# Patient Record
Sex: Female | Born: 1985 | Hispanic: No | Marital: Married | Smoking: Never smoker
Health system: Southern US, Community
[De-identification: ages and names within clinical notes are randomized; demographics above are authoritative.]

## PROBLEM LIST (undated history)

## (undated) DIAGNOSIS — R102 Pelvic and perineal pain unspecified side: Secondary | ICD-10-CM

## (undated) DIAGNOSIS — Z789 Other specified health status: Secondary | ICD-10-CM

## (undated) HISTORY — PX: NO PAST SURGERIES: SHX2092

## (undated) HISTORY — DX: Pelvic and perineal pain: R10.2

## (undated) HISTORY — DX: Pelvic and perineal pain unspecified side: R10.20

---

## 2017-06-12 NOTE — L&D Delivery Note (Signed)
Delivery Note Progressed to complete dilation with vertex at +1.  AROM clear fluid. Pushed well to SVD.  At 8:56 PM a viable and healthy female was delivered via Vaginal, Spontaneous (Presentation: LOA).  APGAR: 9, 9; weight  8+6 .   Placenta status: Spontaneous and grossly intact with 3 vessel Cord:  with the following complications: none  Anesthesia:  epidural Episiotomy: None Lacerations: 3rd degree, 3C Suture Repair: 3.0 vicryl  Repair of 3rd degree done in usual fashion by Dr Adrian Blackwater and rest of repair done by me Est. Blood Loss (mL):  300  Mom to postpartum.  Baby to Couplet care / Skin to Skin.  Wynelle Bourgeois 03/12/2018, 9:57 PM  Please schedule this patient for Postpartum visit in: 4 weeks with the following provider: Any provider For C/S patients schedule nurse incision check in weeks 2 weeks: no Low risk pregnancy complicated by: none Delivery mode:  SVD Anticipated Birth Control:  Nexplanon PP Procedures needed: none  Schedule Integrated BH visit: no

## 2017-07-13 ENCOUNTER — Ambulatory Visit: Payer: Self-pay | Admitting: General Practice

## 2017-07-13 ENCOUNTER — Encounter: Payer: Self-pay | Admitting: General Practice

## 2017-07-13 DIAGNOSIS — Z3201 Encounter for pregnancy test, result positive: Secondary | ICD-10-CM

## 2017-07-13 MED ORDER — METOCLOPRAMIDE HCL 10 MG PO TABS: 10.0000 mg | ORAL_TABLET | Freq: Three times a day (TID) | ORAL | 0 refills | Status: DC | PRN

## 2017-07-13 MED ORDER — PRENATAL VITAMINS 0.8 MG PO TABS: 1.0000 | ORAL_TABLET | Freq: Every day | ORAL | 2 refills | Status: DC

## 2017-07-13 NOTE — Progress Notes (Signed)
Chart reviewed for nurse visit. Agree with plan of care.   Nautia Lem Y, DO   

## 2017-07-13 NOTE — Progress Notes (Signed)
Patient here for UPT today. UPT +. Patient has not taken any tests at home. LMP 05/29/17 EDD 03/05/18 6575w3d. Patient denies taking any meds or vitamins. Rx sent for PNV. Recommended she begin prenatal care. Patient verbalized understanding and had no questions

## 2017-08-22 ENCOUNTER — Ambulatory Visit (INDEPENDENT_AMBULATORY_CARE_PROVIDER_SITE_OTHER): Payer: Medicaid Other | Admitting: Advanced Practice Midwife

## 2017-08-22 ENCOUNTER — Encounter: Payer: Self-pay | Admitting: General Practice

## 2017-08-22 ENCOUNTER — Encounter: Payer: Self-pay | Admitting: Advanced Practice Midwife

## 2017-08-22 VITALS — BP 120/76 | HR 76 | Ht 59.45 in | Wt 132.1 lb

## 2017-08-22 DIAGNOSIS — Z3481 Encounter for supervision of other normal pregnancy, first trimester: Secondary | ICD-10-CM

## 2017-08-22 DIAGNOSIS — T85848A Pain due to other internal prosthetic devices, implants and grafts, initial encounter: Secondary | ICD-10-CM

## 2017-08-22 DIAGNOSIS — Z789 Other specified health status: Secondary | ICD-10-CM | POA: Insufficient documentation

## 2017-08-22 DIAGNOSIS — Z23 Encounter for immunization: Secondary | ICD-10-CM | POA: Diagnosis not present

## 2017-08-22 DIAGNOSIS — Z3480 Encounter for supervision of other normal pregnancy, unspecified trimester: Secondary | ICD-10-CM | POA: Insufficient documentation

## 2017-08-22 LAB — POCT URINALYSIS DIP (DEVICE)
Bilirubin Urine: NEGATIVE
Glucose, UA: NEGATIVE mg/dL
Ketones, ur: NEGATIVE mg/dL
NITRITE: NEGATIVE
PH: 5.5 (ref 5.0–8.0)
PROTEIN: NEGATIVE mg/dL
Specific Gravity, Urine: >=1.03 (ref 1.005–1.030)
Urobilinogen, UA: 0.2 mg/dL (ref 0.0–1.0)

## 2017-08-22 MED ORDER — IBUPROFEN 600 MG PO TABS: 600.0000 mg | ORAL_TABLET | Freq: Four times a day (QID) | ORAL | 0 refills | Status: DC | PRN

## 2017-08-22 MED ORDER — OXYCODONE-ACETAMINOPHEN 5-325 MG PO TABS: 1.0000 | ORAL_TABLET | Freq: Four times a day (QID) | ORAL | 0 refills | Status: DC | PRN

## 2017-08-22 MED ORDER — CONCEPT OB 130-92.4-1 MG PO CAPS: 1.0000 | ORAL_CAPSULE | Freq: Every day | ORAL | 12 refills | Status: DC

## 2017-08-22 NOTE — Patient Instructions (Addendum)
Phone Numbers for Area Dentists **Costs for these may vary depending on what is actually done** Consider contacting other dentists directing in the community Provider/Phone number                                  Cost if Known Dr. Randa Evens, [facility] [place], [place]          $161 for single tooth removal [phone]  [facility]                           [phone] for single tooth extraction [phone]  [facility]                                   $5 Teeth Cleaning $4 xray Ask for Lemmie Evens [address] [facility]: [phone] [place]: [phone]  [facility] [address]                                       No Medicaid, no insurance [phone]                                                     $150 for exam/ xray Fax [phone]  Dr. Melynda Ripple                                                             [phone] for extraction [address] ( [address])                             $74 for exam  Marolyn Hammock DMD [facility]                                  $50 emergency exam,  [facility]                               $20 single xray/$75 panorex [address] [facility]                                                     $120 simple extraction [phone]                                                       $180 surgical extraction 10% discount cash pay [place] Dr. Baldo Ash Little [address] [phone] Dr.  Helen HashimotoGeorge Wallace [address]  [phone]  Safe Medications in Pregnancy   Acne: Benzoyl Peroxide Salicylic Acid  Backache/Headache: Tylenol: 2 regular strength every 4 hours OR              2 Extra strength every 6 hours  Colds/Coughs/Allergies: Benadryl (alcohol free) 25 mg every 6 hours as needed Breath right strips Claritin Cepacol throat lozenges Chloraseptic throat spray Cold-Eeze- up to three times per day Cough drops, alcohol free Flonase (by prescription  only) Guaifenesin Mucinex Robitussin DM (plain only, alcohol free) Saline nasal spray/drops Sudafed (pseudoephedrine) & Actifed ** use only after [redacted] weeks gestation and if you do not have high blood pressure Tylenol Vicks Vaporub Zinc lozenges Zyrtec   Constipation: Colace Ducolax suppositories Fleet enema Glycerin suppositories Metamucil Milk of magnesia Miralax Senokot Smooth move tea  Diarrhea: Kaopectate Imodium A-D  *NO pepto Bismol  Hemorrhoids: Anusol Anusol HC Preparation H Tucks  Indigestion: Tums Maalox Mylanta Zantac  Pepcid  Insomnia: Benadryl (alcohol free) 25mg  every 6 hours as needed Tylenol PM Unisom, no Gelcaps  Leg Cramps: Tums MagGel  Nausea/Vomiting:  Bonine Dramamine Emetrol Ginger extract Sea bands Meclizine  Nausea medication to take during pregnancy:  Unisom (doxylamine succinate 25 mg tablets) Take one tablet daily at bedtime. If symptoms are not adequately controlled, the dose can be increased to a maximum recommended dose of two tablets daily (1/2 tablet in the morning, 1/2 tablet mid-afternoon and one at bedtime). Vitamin B6 100mg  tablets. Take one tablet twice a day (up to 200 mg per day).  Skin Rashes: Aveeno products Benadryl cream or 25mg  every 6 hours as needed Calamine Lotion 1% cortisone cream  Yeast infection: Gyne-lotrimin 7 Monistat 7   Ibuprofen should not be taken after 28 weeks or for more than two days.   **If taking multiple medications, please check labels to avoid duplicating the same active ingredients **take medication as directed on the label ** Do not exceed 4000 mg of tylenol in 24 hours **Do not take medications that contain aspirin or ibuprofen

## 2017-08-22 NOTE — Progress Notes (Signed)
[employer] interpreter Kenard GowerNuha Mohammad

## 2017-08-22 NOTE — Progress Notes (Signed)
  Subjective:    Elizabeth Odonnell is being seen today for her first obstetrical visit. She is at 4559w1d gestation by certain, regular LMP. Her obstetrical history is significant for Nothing. Relationship with FOB: spouse, living together. Patient does intend to breast feed. Pregnancy history fully reviewed.  Patient reports no complaints.  Live Arabic interpreter used.   Review of Systems:   Review of Systems  Constitutional: Negative for fever.  Gastrointestinal: Negative for abdominal pain, nausea and vomiting.  Genitourinary: Negative for vaginal bleeding.    Objective:     BP 120/76   Pulse 76   Ht 4' 11.45" (1.51 m)   Wt 132 lb 1.6 oz (59.9 kg)   LMP 05/29/2017 (Exact Date)   BMI 26.28 kg/m  Physical Exam  Nursing note and vitals reviewed. Constitutional: She is oriented to person, place, and time. She appears well-developed and well-nourished. No distress.  Eyes: No scleral icterus.  Neck: No thyromegaly present.  Cardiovascular: Normal rate, regular rhythm and normal heart sounds.  Respiratory: Effort normal and breath sounds normal. No respiratory distress.  Genitourinary:  Genitourinary Comments: Deferred  Neurological: She is alert and oriented to person, place, and time.  Skin: Skin is warm and dry.  Psychiatric: She has a normal mood and affect.    Maternal Exam:  Abdomen: Patient reports no abdominal tenderness. Fundal height is 17 cm.    Introitus: not evaluated.   Cervix: not evaluated.   Fetal Exam Fetal Monitor Review: Mode: hand-held doppler probe.   Baseline rate: 151.         Assessment:    Pregnancy: G2P1001 Patient Active Problem List   Diagnosis Date Noted  . Supervision of other normal pregnancy, antepartum 08/22/2017  . Language barrier 08/22/2017       Plan:     1. Supervision of other normal pregnancy, antepartum  - Culture, OB Urine - GC/Chlamydia probe amp ([place]ott[place] - Hemoglobinopathy  Evaluation - Obstetric Panel, Including HIV - Flu Vaccine QUAD 36+ mos IM - HgB A1c - Prenat w/o A Vit-FeFum-FePo-FA (CONCEPT OB) 130-92.4-1 MG CAPS; Take 1 tablet by mouth daily.  Dispense: 30 capsule; Refill: 12  2. Language barrier  3. Dental pain  - Dentist list given - Dental letter - IBU PRN sparingly. Percocet #10 for severe pain.   4. Uterine size dates discrepancy   - Dating US w/ Diane in 1 week.   Initial labs NOT drawn. Phlebotomist at lunch. Will come back for lab visit in 1 week Prenatal vitamins. Problem list reviewed and updated. AFP3 discussed: requested when pregnancy Medicaid is active.  Role of ultrasound in pregnancy discussed; fetal survey: requested. Amniocentesis discussed: not indicated. Labs and dating US in 1 week Follow up ROB in 4 weeks.    Elizabeth Odonnell 08/22/2017

## 2017-08-30 ENCOUNTER — Ambulatory Visit: Payer: Self-pay

## 2017-08-30 ENCOUNTER — Ambulatory Visit (INDEPENDENT_AMBULATORY_CARE_PROVIDER_SITE_OTHER): Payer: Medicaid Other | Admitting: General Practice

## 2017-08-30 DIAGNOSIS — Z3491 Encounter for supervision of normal pregnancy, unspecified, first trimester: Secondary | ICD-10-CM

## 2017-08-30 DIAGNOSIS — Z3687 Encounter for antenatal screening for uncertain dates: Secondary | ICD-10-CM

## 2017-08-30 NOTE — Progress Notes (Signed)
Pt informed that the ultrasound is considered a limited OB ultrasound and is not intended to be a complete ultrasound exam.  Patient also informed that the ultrasound is not being completed with the intent of assessing for fetal or placental anomalies or any pelvic abnormalities.  Explained that the purpose of today's ultrasound is to assess for  dating.  Patient acknowledges the purpose of the exam and the limitations of the study.    Ultrasound dating consistent with LMP. New OB labs drawn

## 2017-09-01 LAB — URINE CULTURE, OB REFLEX

## 2017-09-01 LAB — CULTURE, OB URINE

## 2017-09-05 LAB — OBSTETRIC PANEL, INCLUDING HIV
Antibody Screen: NEGATIVE
BASOS ABS: 0 10*3/uL (ref 0.0–0.2)
Basos: 0 %
EOS (ABSOLUTE): 0.1 10*3/uL (ref 0.0–0.4)
Eos: 1 %
HEP B S AG: NEGATIVE
HIV SCREEN 4TH GENERATION: NONREACTIVE
Hematocrit: 35.9 % (ref 34.0–46.6)
Hemoglobin: 12.4 g/dL (ref 11.1–15.9)
IMMATURE GRANULOCYTES: 0 %
Immature Grans (Abs): 0 10*3/uL (ref 0.0–0.1)
LYMPHS ABS: 2.2 10*3/uL (ref 0.7–3.1)
Lymphs: 27 %
MCH: 25.6 pg — AB (ref 26.6–33.0)
MCHC: 34.5 g/dL (ref 31.5–35.7)
MCV: 74 fL — AB (ref 79–97)
MONOS ABS: 0.3 10*3/uL (ref 0.1–0.9)
Monocytes: 4 %
NEUTROS ABS: 5.5 10*3/uL (ref 1.4–7.0)
NEUTROS PCT: 68 %
PLATELETS: 303 10*3/uL (ref 150–379)
RBC: 4.85 x10E6/uL (ref 3.77–5.28)
RDW: 18.6 % — ABNORMAL HIGH (ref 12.3–15.4)
RPR: NONREACTIVE
Rh Factor: POSITIVE
Rubella Antibodies, IGG: 3.88 index (ref >0.99)
WBC: 8.1 10*3/uL (ref 3.4–10.8)

## 2017-09-05 LAB — HEMOGLOBINOPATHY EVALUATION
Ferritin: 15 ng/mL (ref 15–150)
HGB A2 QUANT: 2.1 % (ref 1.8–3.2)
HGB A: 97.9 % (ref 96.4–98.8)
HGB C: 0 %
HGB F QUANT: 0 % (ref 0.0–2.0)
HGB VARIANT: 0 %
Hgb S: 0 %
Hgb Solubility: NEGATIVE

## 2017-09-05 LAB — HEMOGLOBIN A1C
ESTIMATED AVERAGE GLUCOSE: 100 mg/dL
HEMOGLOBIN A1C: 5.1 % (ref 4.8–5.6)

## 2017-09-05 LAB — ALPHA-THALASSEMIA

## 2017-09-19 ENCOUNTER — Other Ambulatory Visit (HOSPITAL_COMMUNITY)
Admission: RE | Admit: 2017-09-19 | Discharge: 2017-09-19 | Disposition: A | Discharge status: Home / Self Care | Payer: Medicaid Other | Source: Ambulatory Visit | Attending: Advanced Practice Midwife | Admitting: Advanced Practice Midwife

## 2017-09-19 ENCOUNTER — Ambulatory Visit (INDEPENDENT_AMBULATORY_CARE_PROVIDER_SITE_OTHER): Payer: Medicaid Other | Admitting: Advanced Practice Midwife

## 2017-09-19 VITALS — BP 122/66 | HR 93 | Wt 134.0 lb

## 2017-09-19 DIAGNOSIS — Z3480 Encounter for supervision of other normal pregnancy, unspecified trimester: Secondary | ICD-10-CM | POA: Insufficient documentation

## 2017-09-19 DIAGNOSIS — B373 Candidiasis of vulva and vagina: Secondary | ICD-10-CM

## 2017-09-19 DIAGNOSIS — B3731 Acute candidiasis of vulva and vagina: Secondary | ICD-10-CM

## 2017-09-19 NOTE — Patient Instructions (Addendum)
Take Monistat 7 for yeast infection  Second Trimester of Pregnancy The second trimester is from week 13 through week 28, month 4 through 6. This is often the time in pregnancy that you feel your best. Often times, morning sickness has lessened or quit. You may have more energy, and you may get hungry more often. Your unborn baby (fetus) is growing rapidly. At the end of the sixth month, he or she is about 9 inches long and weighs about 1 pounds. You will likely feel the baby move (quickening) between 18 and 20 weeks of pregnancy. Follow these instructions at home:  Avoid all smoking, herbs, and alcohol. Avoid drugs not approved by your doctor.  Do not use any tobacco products, including cigarettes, chewing tobacco, and electronic cigarettes. If you need help quitting, ask your doctor. You may get counseling or other support to help you quit.  Only take medicine as told by your doctor. Some medicines are safe and some are not during pregnancy.  Exercise only as told by your doctor. Stop exercising if you start having cramps.  Eat regular, healthy meals.  Wear a good support bra if your breasts are tender.  Do not use hot tubs, steam rooms, or saunas.  Wear your seat belt when driving.  Avoid raw meat, uncooked cheese, and liter boxes and soil used by cats.  Take your prenatal vitamins.  Take 1500-2000 milligrams of calcium daily starting at the 20th week of pregnancy until you deliver your baby.  Try taking medicine that helps you poop (stool softener) as needed, and if your doctor approves. Eat more fiber by eating fresh fruit, vegetables, and whole grains. Drink enough fluids to keep your pee (urine) clear or pale yellow.  Take warm water baths (sitz baths) to soothe pain or discomfort caused by hemorrhoids. Use hemorrhoid cream if your doctor approves.  If you have puffy, bulging veins (varicose veins), wear support hose. Raise (elevate) your feet for 15 minutes, 3-4 times a day.  Limit salt in your diet.  Avoid heavy lifting, wear low heals, and sit up straight.  Rest with your legs raised if you have leg cramps or low back pain.  Visit your dentist if you have not gone during your pregnancy. Use a soft toothbrush to brush your teeth. Be gentle when you floss.  You can have sex (intercourse) unless your doctor tells you not to.  Go to your doctor visits. Get help if:  You feel dizzy.  You have mild cramps or pressure in your lower belly (abdomen).  You have a nagging pain in your belly area.  You continue to feel sick to your stomach (nauseous), throw up (vomit), or have watery poop (diarrhea).  You have bad smelling fluid coming from your vagina.  You have pain with peeing (urination). Get help right away if:  You have a fever.  You are leaking fluid from your vagina.  You have spotting or bleeding from your vagina.  You have severe belly cramping or pain.  You lose or gain weight rapidly.  You have trouble catching your breath and have chest pain.  You notice sudden or extreme puffiness (swelling) of your face, hands, ankles, feet, or legs.  You have not felt the baby move in over an hour.  You have severe headaches that do not go away with medicine.  You have vision changes. This information is not intended to replace advice given to you by your health care provider. Make sure you discuss any  questions you have with your health care provider. Document Released: 08/23/2009 Document Revised: 11/04/2015 Document Reviewed: 07/30/2012 Elsevier Interactive Patient Education  2017 [employer].

## 2017-09-19 NOTE — Progress Notes (Signed)
bInterpreter Elizabeth Odonnell

## 2017-09-19 NOTE — Progress Notes (Signed)
   PRENATAL VISIT NOTE  Subjective:  Elizabeth Odonnell is a 32 y.o. G2P1001 at 5849w1d being seen today for ongoing prenatal care.  She is currently monitored for the following issues for this low-risk pregnancy and has Supervision of other normal pregnancy, antepartum and Language barrier on their problem list.  Patient reports no complaints.  Contractions: Not present. Vag. Bleeding: None.  Movement: Present. Denies leaking of fluid.   The following portions of the patient's history were reviewed and updated as appropriate: allergies, current medications, past family history, past medical history, past social history, past surgical history and problem list. Problem list updated.  Objective:   Vitals:   09/19/17 1019  BP: 122/66  Pulse: 93  Weight: 134 lb (60.8 kg)    Fetal Status: Fetal Heart Rate (bpm): 149  Fundal Height: 18 cm Movement: Present     General:  Alert, oriented and cooperative. Patient is in no acute distress.  Skin: Skin is warm and dry. No rash noted.   Cardiovascular: Normal heart rate noted  Respiratory: Normal respiratory effort, no problems with respiration noted  Abdomen: Soft, gravid, appropriate for gestational age.  Pain/Pressure: Absent     Pelvic: Cervical exam performed Dilation: Closed    Moderate amount if thick, white, curdlike discharge. Erythematous vaginal walls.   Extremities: Normal range of motion.  Edema: None  Mental Status: Normal mood and affect. Normal behavior. Normal judgment and thought content.   Reviewed OB Panel  Assessment and Plan:  Pregnancy: G2P1001 at 4549w1d  1. Supervision of other normal pregnancy, antepartum  - US MFM OB COMP + 14 WK; Future - Cytology - PAP  2. Vaginal Yeast infection  - OTC Monistat 7  Preterm labor symptoms and general obstetric precautions including but not limited to vaginal bleeding, contractions, leaking of fluid and fetal movement were reviewed in detail with the patient. Please  refer to After Visit Summary for other counseling recommendations.  Return in about 1 month (around 10/17/2017) for ROB.  Future Appointments  Date Time Provider Department Center  10/12/2017  8:15 AM [facility] US 4 [facility]    Dorathy KinsmanVirginia Jacquese Cassarino, [place]

## 2017-09-21 LAB — CYTOLOGY - PAP
Diagnosis: NEGATIVE
HPV (WINDOPATH): NOT DETECTED

## 2017-10-12 ENCOUNTER — Ambulatory Visit (HOSPITAL_COMMUNITY)
Admission: RE | Admit: 2017-10-12 | Discharge: 2017-10-12 | Disposition: A | Discharge status: Home / Self Care | Payer: Medicaid Other | Source: Ambulatory Visit | Attending: Advanced Practice Midwife | Admitting: Advanced Practice Midwife

## 2017-10-12 DIAGNOSIS — Z3A19 19 weeks gestation of pregnancy: Secondary | ICD-10-CM | POA: Diagnosis not present

## 2017-10-12 DIAGNOSIS — Z3480 Encounter for supervision of other normal pregnancy, unspecified trimester: Secondary | ICD-10-CM

## 2017-10-12 DIAGNOSIS — Z363 Encounter for antenatal screening for malformations: Secondary | ICD-10-CM | POA: Diagnosis present

## 2017-10-12 DIAGNOSIS — Z3689 Encounter for other specified antenatal screening: Secondary | ICD-10-CM | POA: Insufficient documentation

## 2017-10-17 ENCOUNTER — Other Ambulatory Visit: Payer: Self-pay | Admitting: Medical

## 2017-10-17 ENCOUNTER — Ambulatory Visit (INDEPENDENT_AMBULATORY_CARE_PROVIDER_SITE_OTHER): Payer: Medicaid Other | Admitting: Medical

## 2017-10-17 ENCOUNTER — Encounter: Payer: Self-pay | Admitting: *Deleted

## 2017-10-17 ENCOUNTER — Encounter: Payer: Self-pay | Admitting: Medical

## 2017-10-17 VITALS — BP 118/78 | HR 96 | Wt 135.1 lb

## 2017-10-17 DIAGNOSIS — Z0489 Encounter for examination and observation for other specified reasons: Secondary | ICD-10-CM

## 2017-10-17 DIAGNOSIS — Z3A26 26 weeks gestation of pregnancy: Secondary | ICD-10-CM

## 2017-10-17 DIAGNOSIS — IMO0002 Reserved for concepts with insufficient information to code with codable children: Secondary | ICD-10-CM

## 2017-10-17 DIAGNOSIS — Z3480 Encounter for supervision of other normal pregnancy, unspecified trimester: Secondary | ICD-10-CM

## 2017-10-17 NOTE — Patient Instructions (Signed)
Second Trimester of Pregnancy The second trimester is from week 13 through week 28, month 4 through 6. This is often the time in pregnancy that you feel your best. Often times, morning sickness has lessened or quit. You may have more energy, and you may get hungry more often. Your unborn baby (fetus) is growing rapidly. At the end of the sixth month, he or she is about 9 inches long and weighs about 1 pounds. You will likely feel the baby move (quickening) between 18 and 20 weeks of pregnancy. Follow these instructions at home:  Avoid all smoking, herbs, and alcohol. Avoid drugs not approved by your doctor.  Do not use any tobacco products, including cigarettes, chewing tobacco, and electronic cigarettes. If you need help quitting, ask your doctor. You may get counseling or other support to help you quit.  Only take medicine as told by your doctor. Some medicines are safe and some are not during pregnancy.  Exercise only as told by your doctor. Stop exercising if you start having cramps.  Eat regular, healthy meals.  Wear a good support bra if your breasts are tender.  Do not use hot tubs, steam rooms, or saunas.  Wear your seat belt when driving.  Avoid raw meat, uncooked cheese, and liter boxes and soil used by cats.  Take your prenatal vitamins.  Take 1500-2000 milligrams of calcium daily starting at the 20th week of pregnancy until you deliver your baby.  Try taking medicine that helps you poop (stool softener) as needed, and if your doctor approves. Eat more fiber by eating fresh fruit, vegetables, and whole grains. Drink enough fluids to keep your pee (urine) clear or pale yellow.  Take warm water baths (sitz baths) to soothe pain or discomfort caused by hemorrhoids. Use hemorrhoid cream if your doctor approves.  If you have puffy, bulging veins (varicose veins), wear support hose. Raise (elevate) your feet for 15 minutes, 3-4 times a day. Limit salt in your diet.  Avoid heavy  lifting, wear low heals, and sit up straight.  Rest with your legs raised if you have leg cramps or low back pain.  Visit your dentist if you have not gone during your pregnancy. Use a soft toothbrush to brush your teeth. Be gentle when you floss.  You can have sex (intercourse) unless your doctor tells you not to.  Go to your doctor visits. Get help if:  You feel dizzy.  You have mild cramps or pressure in your lower belly (abdomen).  You have a nagging pain in your belly area.  You continue to feel sick to your stomach (nauseous), throw up (vomit), or have watery poop (diarrhea).  You have bad smelling fluid coming from your vagina.  You have pain with peeing (urination). Get help right away if:  You have a fever.  You are leaking fluid from your vagina.  You have spotting or bleeding from your vagina.  You have severe belly cramping or pain.  You lose or gain weight rapidly.  You have trouble catching your breath and have chest pain.  You notice sudden or extreme puffiness (swelling) of your face, hands, ankles, feet, or legs.  You have not felt the baby move in over an hour.  You have severe headaches that do not go away with medicine.  You have vision changes. This information is not intended to replace advice given to you by your health care provider. Make sure you discuss any questions you have with your health care   provider. Document Released: 08/23/2009 Document Revised: 11/04/2015 Document Reviewed: 07/30/2012 Elsevier Interactive Patient Education  2017 [employer] Inc[employer]  

## 2017-10-17 NOTE — Progress Notes (Signed)
   PRENATAL VISIT NOTE  Subjective:  Elizabeth Odonnell is a 32 y.o. G2P1001 at [redacted]w[redacted]d being seen today for ongoing prenatal care.  She is currently monitored for the following issues for this low-risk pregnancy and has Supervision of other normal pregnancy, antepartum and Language barrier on their problem list.  Patient reports no complaints.  Contractions: Not present. Vag. Bleeding: None.  Movement: Present. Denies leaking of fluid.   The following portions of the patient's history were reviewed and updated as appropriate: allergies, current medications, past family history, past medical history, past social history, past surgical history and problem list. Problem list updated.  Objective:   Vitals:   10/17/17 1020  BP: 118/78  Pulse: 96  Weight: 135 lb 1.6 oz (61.3 kg)    Fetal Status: Fetal Heart Rate (bpm): 143 Fundal Height: 21 cm Movement: Present     General:  Alert, oriented and cooperative. Patient is in no acute distress.  Skin: Skin is warm and dry. No rash noted.   Cardiovascular: Normal heart rate noted  Respiratory: Normal respiratory effort, no problems with respiration noted  Abdomen: Soft, gravid, appropriate for gestational age.  Pain/Pressure: Absent     Pelvic: Cervical exam deferred        Extremities: Normal range of motion.  Edema: None  Mental Status: Normal mood and affect. Normal behavior. Normal judgment and thought content.   Assessment and Plan:  Pregnancy: G2P1001 at [redacted]w[redacted]d  1. Supervision of other normal pregnancy, antepartum - Genetic Screening - AFP, Serum, Open Spina Bifida  2. Evaluate anatomy not seen on prior sonogram - [place] MFM OB FOLLOW UP; scheduled for ~ 6 weeks out    Preterm labor symptoms and general obstetric precautions including but not limited to vaginal bleeding, contractions, leaking of fluid and fetal movement were reviewed in detail with the patient. Please refer to After Visit Summary for other counseling  recommendations.  Return in about 1 month (around 11/14/2017) for LOB.  No future appointments.  Vonzella Nipple, PA-C

## 2017-10-17 NOTE — Progress Notes (Signed)
Arabic interpreter [place] used for visit

## 2017-10-22 LAB — AFP, SERUM, OPEN SPINA BIFIDA
AFP MoM: 0.73
AFP VALUE AFPOSL: 43.4 ng/mL
Gest. Age on Collection Date: 20 weeks
Maternal Age At EDD: 30.9 yr
OSBR Risk 1 IN: 10000
Test Results:: NEGATIVE
Weight: 135 [lb_av]

## 2017-10-23 ENCOUNTER — Encounter: Payer: Self-pay | Admitting: *Deleted

## 2017-11-20 ENCOUNTER — Ambulatory Visit (INDEPENDENT_AMBULATORY_CARE_PROVIDER_SITE_OTHER): Payer: Medicaid Other | Admitting: Medical

## 2017-11-20 ENCOUNTER — Encounter: Payer: Self-pay | Admitting: Medical

## 2017-11-20 VITALS — BP 122/71 | HR 94 | Wt 138.7 lb

## 2017-11-20 DIAGNOSIS — Z3480 Encounter for supervision of other normal pregnancy, unspecified trimester: Secondary | ICD-10-CM

## 2017-11-20 DIAGNOSIS — Z3482 Encounter for supervision of other normal pregnancy, second trimester: Secondary | ICD-10-CM

## 2017-11-20 DIAGNOSIS — R42 Dizziness and giddiness: Secondary | ICD-10-CM

## 2017-11-20 DIAGNOSIS — N949 Unspecified condition associated with female genital organs and menstrual cycle: Secondary | ICD-10-CM

## 2017-11-20 NOTE — Progress Notes (Signed)
   PRENATAL VISIT NOTE  Subjective:  Myrical Mase Janeth Raseli Odonnell is a 32 y.o. G2P1001 at 5738w0d being seen today for ongoing prenatal care.  She is currently monitored for the following issues for this low-risk pregnancy and has Supervision of other normal pregnancy, antepartum and Language barrier on their problem list.  Patient reports dizziness, intermittent LLQ pain with change of positions.  Contractions: Not present. Vag. Bleeding: None.  Movement: Present. Denies leaking of fluid.   The following portions of the patient's history were reviewed and updated as appropriate: allergies, current medications, past family history, past medical history, past social history, past surgical history and problem list. Problem list updated.  Objective:   Vitals:   11/20/17 1055  BP: 122/71  Pulse: 94  Weight: 138 lb 11.2 oz (62.9 kg)    Fetal Status: Fetal Heart Rate (bpm): 144 Fundal Height: 25 cm Movement: Present     General:  Alert, oriented and cooperative. Patient is in no acute distress.  Skin: Skin is warm and dry. No rash noted.   Cardiovascular: Normal heart rate noted  Respiratory: Normal respiratory effort, no problems with respiration noted  Abdomen: Soft, gravid, appropriate for gestational age.  Pain/Pressure: Absent     Pelvic: Cervical exam deferred        Extremities: Normal range of motion.  Edema: None  Mental Status: Normal mood and affect. Normal behavior. Normal judgment and thought content.   Assessment and Plan:  Pregnancy: G2P1001 at 2638w0d  1. Supervision of other normal pregnancy, antepartum  2. Dizziness - Advised to increase PO hydration and have small frequent snacks throughout the day - Avoid excess sugar - Increase PO hydration   3. Round ligament pain - Tylenol PRN for pain - Slow change of positions  Preterm labor symptoms and general obstetric precautions including but not limited to vaginal bleeding, contractions, leaking of fluid and fetal  movement were reviewed in detail with the patient. Please refer to After Visit Summary for other counseling recommendations.  Return in about 3 weeks (around 12/11/2017) for LOB, 28 week labs (fasting).  Future Appointments  Date Time Provider Department Center  11/28/2017 10:45 AM [facility] US 2 [facility]    Vonzella NippleJulie Wenzel, PA-C

## 2017-11-20 NOTE — Patient Instructions (Addendum)
Second Trimester of Pregnancy The second trimester is from week 13 through week 28, month 4 through 6. This is often the time in pregnancy that you feel your best. Often times, morning sickness has lessened or quit. You may have more energy, and you may get hungry more often. Your unborn baby (fetus) is growing rapidly. At the end of the sixth month, he or she is about 9 inches long and weighs about 1 pounds. You will likely feel the baby move (quickening) between 18 and 20 weeks of pregnancy.  Research childbirth classes and hospital preregistration at [email or web address]  Follow these instructions at home:  Avoid all smoking, herbs, and alcohol. Avoid drugs not approved by your doctor.  Do not use any tobacco products, including cigarettes, chewing tobacco, and electronic cigarettes. If you need help quitting, ask your doctor. You may get counseling or other support to help you quit.  Only take medicine as told by your doctor. Some medicines are safe and some are not during pregnancy.  Exercise only as told by your doctor. Stop exercising if you start having cramps.  Eat regular, healthy meals.  Wear a good support bra if your breasts are tender.  Do not use hot tubs, steam rooms, or saunas.  Wear your seat belt when driving.  Avoid raw meat, uncooked cheese, and liter boxes and soil used by cats.  Take your prenatal vitamins.  Take 1500-2000 milligrams of calcium daily starting at the 20th week of pregnancy until you deliver your baby.  Try taking medicine that helps you poop (stool softener) as needed, and if your doctor approves. Eat more fiber by eating fresh fruit, vegetables, and whole grains. Drink enough fluids to keep your pee (urine) clear or pale yellow.  Take warm water baths (sitz baths) to soothe pain or discomfort caused by hemorrhoids. Use hemorrhoid cream if your doctor approves.  If you have puffy, bulging veins (varicose veins), wear support hose. Raise  (elevate) your feet for 15 minutes, 3-4 times a day. Limit salt in your diet.  Avoid heavy lifting, wear low heals, and sit up straight.  Rest with your legs raised if you have leg cramps or low back pain.  Visit your dentist if you have not gone during your pregnancy. Use a soft toothbrush to brush your teeth. Be gentle when you floss.  You can have sex (intercourse) unless your doctor tells you not to.  Go to your doctor visits.  Get help if:  You feel dizzy.  You have mild cramps or pressure in your lower belly (abdomen).  You have a nagging pain in your belly area.  You continue to feel sick to your stomach (nauseous), throw up (vomit), or have watery poop (diarrhea).  You have bad smelling fluid coming from your vagina.  You have pain with peeing (urination). Get help right away if:  You have a fever.  You are leaking fluid from your vagina.  You have spotting or bleeding from your vagina.  You have severe belly cramping or pain.  You lose or gain weight rapidly.  You have trouble catching your breath and have chest pain.  You notice sudden or extreme puffiness (swelling) of your face, hands, ankles, feet, or legs.  You have not felt the baby move in over an hour.  You have severe headaches that do not go away with medicine.  You have vision changes. This information is not intended to replace advice given to you by your health care provider. Make   sure you discuss any questions you have with your health care provider. Document Released: 08/23/2009 Document Revised: 11/04/2015 Document Reviewed: 07/30/2012 Elsevier Interactive Patient Education  2017 [employer]    Dehydration, Adult Dehydration is when there is not enough fluid or water in your body. This happens when you lose more fluids than you take in. Dehydration can range from mild to very bad. It should be treated right away to keep it from getting very bad. Symptoms of mild dehydration may  include:  Thirst.  Dry lips.  Slightly dry mouth.  Dry, warm skin.  Dizziness. Symptoms of moderate dehydration may include:  Very dry mouth.  Muscle cramps.  Dark pee (urine). Pee may be the color of tea.  Your body making less pee.  Your eyes making fewer tears.  Heartbeat that is uneven or faster than normal (palpitations).  Headache.  Light-headedness, especially when you stand up from sitting.  Fainting (syncope). Symptoms of very bad dehydration may include:  Changes in skin, such as: ? Cold and clammy skin. ? Blotchy (mottled) or pale skin. ? Skin that does not quickly return to normal after being lightly pinched and let go (poor skin turgor).  Changes in body fluids, such as: ? Feeling very thirsty. ? Your eyes making fewer tears. ? Not sweating when body temperature is high, such as in hot weather. ? Your body making very little pee.  Changes in vital signs, such as: ? Weak pulse. ? Pulse that is more than 100 beats a minute when you are sitting still. ? Fast breathing. ? Low blood pressure.  Other changes, such as: ? Sunken eyes. ? Cold hands and feet. ? Confusion. ? Lack of energy (lethargy). ? Trouble waking up from sleep. ? Short-term weight loss. ? Unconsciousness. Follow these instructions at home:  If told by your doctor, drink an ORS: ? Make an ORS by using instructions on the package. ? Start by drinking small amounts, about  cup (120 mL) every 5-10 minutes. ? Slowly drink more until you have had the amount that your doctor said to have.  Drink enough clear fluid to keep your pee clear or pale yellow. If you were told to drink an ORS, finish the ORS first, then start slowly drinking clear fluids. Drink fluids such as: ? Water. Do not drink only water by itself. Doing that can make the salt (sodium) level in your body get too low (hyponatremia). ? Ice chips. ? Fruit juice that you have added water to (diluted). ? Low-calorie sports  drinks.  Avoid: ? Alcohol. ? Drinks that have a lot of sugar. These include high-calorie sports drinks, fruit juice that does not have water added, and soda. ? Caffeine. ? Foods that are greasy or have a lot of fat or sugar.  Take over-the-counter and prescription medicines only as told by your doctor.  Do not take salt tablets. Doing that can make the salt level in your body get too high (hypernatremia).  Eat foods that have minerals (electrolytes). Examples include bananas, oranges, potatoes, tomatoes, and spinach.  Keep all follow-up visits as told by your doctor. This is important. Contact a doctor if:  You have belly (abdominal) pain that: ? Gets worse. ? Stays in one area (localizes).  You have a rash.  You have a stiff neck.  You get angry or annoyed more easily than normal (irritability).  You are more sleepy than normal.  You have a harder time waking up than normal.  You feel: ?  Weak. ? Dizzy. ? Very thirsty.  You have peed (urinated) only a small amount of very dark pee during 6-8 hours. Get help right away if:  You have symptoms of very bad dehydration.  You cannot drink fluids without throwing up (vomiting).  Your symptoms get worse with treatment.  You have a fever.  You have a very bad headache.  You are throwing up or having watery poop (diarrhea) and it: ? Gets worse. ? Does not go away.  You have blood or something green (bile) in your throw-up.  You have blood in your poop (stool). This may cause poop to look black and tarry.  You have not peed in 6-8 hours.  You pass out (faint).  Your heart rate when you are sitting still is more than 100 beats a minute.  You have trouble breathing. This information is not intended to replace advice given to you by your health care provider. Make sure you discuss any questions you have with your health care provider. Document Released: 03/25/2009 Document Revised: 12/17/2015 Document Reviewed:  07/23/2015 Elsevier Interactive Patient Education  2018 [employer]. Round Ligament Pain The round ligament is a cord of muscle and tissue that helps to support the uterus. It can become a source of pain during pregnancy if it becomes stretched or twisted as the baby grows. The pain usually begins in the second trimester of pregnancy, and it can come and go until the baby is delivered. It is not a serious problem, and it does not cause harm to the baby. Round ligament pain is usually a short, sharp, and pinching pain, but it can also be a dull, lingering, and aching pain. The pain is felt in the lower side of the abdomen or in the groin. It usually starts deep in the groin and moves up to the outside of the hip area. Pain can occur with:  A sudden change in position.  Rolling over in bed.  Coughing or sneezing.  Physical activity.  Follow these instructions at home: Watch your condition for any changes. Take these steps to help with your pain:  When the pain starts, relax. Then try: ? Sitting down. ? Flexing your knees up to your abdomen. ? Lying on your side with one pillow under your abdomen and another pillow between your legs. ? Sitting in a warm bath for 15-20 minutes or until the pain goes away.  Take over-the-counter and prescription medicines only as told by your health care provider.  Move slowly when you sit and stand.  Avoid long walks if they cause pain.  Stop or lessen your physical activities if they cause pain.  Contact a health care provider if:  Your pain does not go away with treatment.  You feel pain in your back that you did not have before.  Your medicine is not helping. Get help right away if:  You develop a fever or chills.  You develop uterine contractions.  You develop vaginal bleeding.  You develop nausea or vomiting.  You develop diarrhea.  You have pain when you urinate. This information is not intended to replace advice given to you by  your health care provider. Make sure you discuss any questions you have with your health care provider. Document Released: 03/07/2008 Document Revised: 11/04/2015 Document Reviewed: 08/05/2014 Elsevier Interactive Patient Education  [employer].

## 2017-11-20 NOTE — Progress Notes (Signed)
Pt states has been experiencing a lot of dizziness.

## 2017-11-28 ENCOUNTER — Ambulatory Visit (HOSPITAL_COMMUNITY)
Admission: RE | Admit: 2017-11-28 | Discharge: 2017-11-28 | Disposition: A | Discharge status: Home / Self Care | Payer: Medicaid Other | Source: Ambulatory Visit | Attending: Medical | Admitting: Medical

## 2017-11-28 DIAGNOSIS — Z0489 Encounter for examination and observation for other specified reasons: Secondary | ICD-10-CM

## 2017-11-28 DIAGNOSIS — IMO0002 Reserved for concepts with insufficient information to code with codable children: Secondary | ICD-10-CM

## 2017-11-28 DIAGNOSIS — Z362 Encounter for other antenatal screening follow-up: Secondary | ICD-10-CM | POA: Insufficient documentation

## 2017-11-28 DIAGNOSIS — Z3A26 26 weeks gestation of pregnancy: Secondary | ICD-10-CM | POA: Insufficient documentation

## 2017-12-12 ENCOUNTER — Ambulatory Visit (INDEPENDENT_AMBULATORY_CARE_PROVIDER_SITE_OTHER): Payer: Medicaid Other | Admitting: Advanced Practice Midwife

## 2017-12-12 ENCOUNTER — Other Ambulatory Visit: Payer: Self-pay | Admitting: General Practice

## 2017-12-12 ENCOUNTER — Other Ambulatory Visit: Payer: Medicaid Other

## 2017-12-12 VITALS — BP 112/74 | HR 98 | Wt 141.2 lb

## 2017-12-12 DIAGNOSIS — O26899 Other specified pregnancy related conditions, unspecified trimester: Secondary | ICD-10-CM

## 2017-12-12 DIAGNOSIS — Z3480 Encounter for supervision of other normal pregnancy, unspecified trimester: Secondary | ICD-10-CM

## 2017-12-12 DIAGNOSIS — R102 Pelvic and perineal pain: Secondary | ICD-10-CM

## 2017-12-12 DIAGNOSIS — R42 Dizziness and giddiness: Secondary | ICD-10-CM

## 2017-12-12 DIAGNOSIS — Z3483 Encounter for supervision of other normal pregnancy, third trimester: Secondary | ICD-10-CM

## 2017-12-12 DIAGNOSIS — O26893 Other specified pregnancy related conditions, third trimester: Secondary | ICD-10-CM

## 2017-12-12 DIAGNOSIS — Z23 Encounter for immunization: Secondary | ICD-10-CM

## 2017-12-12 NOTE — Patient Instructions (Addendum)
Third Trimester of Pregnancy The third trimester is from week 29 through week 42, months 7 through 9. This trimester is when your unborn baby (fetus) is growing very fast. At the end of the ninth month, the unborn baby is about 20 inches in length. It weighs about 6-10 pounds. Follow these instructions at home:  Avoid all smoking, herbs, and alcohol. Avoid drugs not approved by your doctor.  Do not use any tobacco products, including cigarettes, chewing tobacco, and electronic cigarettes. If you need help quitting, ask your doctor. You may get counseling or other support to help you quit.  Only take medicine as told by your doctor. Some medicines are safe and some are not during pregnancy.  Exercise only as told by your doctor. Stop exercising if you start having cramps.  Eat regular, healthy meals.  Wear a good support bra if your breasts are tender.  Do not use hot tubs, steam rooms, or saunas.  Wear your seat belt when driving.  Avoid raw meat, uncooked cheese, and liter boxes and soil used by cats.  Take your prenatal vitamins.  Take 1500-2000 milligrams of calcium daily starting at the 20th week of pregnancy until you deliver your baby.  Try taking medicine that helps you poop (stool softener) as needed, and if your doctor approves. Eat more fiber by eating fresh fruit, vegetables, and whole grains. Drink enough fluids to keep your pee (urine) clear or pale yellow.  Take warm water baths (sitz baths) to soothe pain or discomfort caused by hemorrhoids. Use hemorrhoid cream if your doctor approves.  If you have puffy, bulging veins (varicose veins), wear support hose. Raise (elevate) your feet for 15 minutes, 3-4 times a day. Limit salt in your diet.  Avoid heavy lifting, wear low heels, and sit up straight.  Rest with your legs raised if you have leg cramps or low back pain.  Visit your dentist if you have not gone during your pregnancy. Use a soft toothbrush to brush your  teeth. Be gentle when you floss.  You can have sex (intercourse) unless your doctor tells you not to.  Do not travel far distances unless you must. Only do so with your doctor's approval.  Take prenatal classes.  Practice driving to the hospital.  Pack your hospital bag.  Prepare the baby's room.  Go to your doctor visits. Get help if:  You are not sure if you are in labor or if your water has broken.  You are dizzy.  You have mild cramps or pressure in your lower belly (abdominal).  You have a nagging pain in your belly area.  You continue to feel sick to your stomach (nauseous), throw up (vomit), or have watery poop (diarrhea).  You have bad smelling fluid coming from your vagina.  You have pain with peeing (urination). Get help right away if:  You have a fever.  You are leaking fluid from your vagina.  You are spotting or bleeding from your vagina.  You have severe belly cramping or pain.  You lose or gain weight rapidly.  You have trouble catching your breath and have chest pain.  You notice sudden or extreme puffiness (swelling) of your face, hands, ankles, feet, or legs.  You have not felt the baby move in over an hour.  You have severe headaches that do not go away with medicine.  You have vision changes. This information is not intended to replace advice given to you by your health care provider. Make   sure you discuss any questions you have with your health care provider. Document Released: 08/23/2009 Document Revised: 11/04/2015 Document Reviewed: 07/30/2012 Elsevier Interactive Patient Education  2017 [place] Education Options: [facility] Department Classes:  Childbirth education classes can help you get ready for a positive parenting experience. You can also meet other expectant parents and get free stuff for your baby. Each class runs for five weeks on the same night and costs $45 for the mother-to-be and her support  person. Medicaid covers the cost if you are eligible. Call [phone] to register. [facility] Childbirth Education:  [phone] or [phone] or [email or web address]  Baby & Me Class: Discuss newborn & infant parenting and family adjustment issues with other new mothers in a relaxed environment. Each week brings a new speaker or baby-centered activity. We encourage new mothers to join [place] every Thursday at 11:00am. Babies birth until crawling. No registration or fee. Daddy [employer]: This course offers Dads-to-be the tools and knowledge needed to feel confident on their journey to becoming new fathers. Experienced dads, who have been trained as coaches, teach dads-to-be how to hold, comfort, diaper, swaddle and play with their infant while being able to support the new mom as well. A class for men taught by men. $25/dad Big Brother/Big Sister: Let your children share in the joy of a new brother or sister in this special class designed just for them. Class includes discussion about how families care for babies: swaddling, holding, diapering, safety as well as how they can be helpful in their new role. This class is designed for children ages 71 to 33, but any age is welcome. Please register each child individually. $5/child  Mom Talk: This mom-led group offers support and connection to mothers as they journey through the adjustments and struggles of that sometimes overwhelming first year after the birth of a child. Tuesdays at 10:00am and Thursdays at 6:00pm. Babies welcome. No registration or fee. Breastfeeding Support Group: This group is a mother-to-mother support circle where moms have the opportunity to share their breastfeeding experiences. A Lactation Consultant is present for questions and concerns. Meets each Tuesday at 11:00am. No fee or registration. Breastfeeding Your Baby: Learn what to expect in the first days of breastfeeding your newborn.  This class will help you feel more  confident with the skills needed to begin your breastfeeding experience. Many new mothers are concerned about breastfeeding after leaving the hospital. This class will also address the most common fears and challenges about breastfeeding during the first few weeks, months and beyond. (call for fee) Comfort Techniques and Tour: This 2 hour interactive class will provide you the opportunity to learn & practice hands-on techniques that can help relieve some of the discomfort of labor and encourage your baby to rotate toward the best position for birth. You and your partner will be able to try a variety of labor positions with birth balls and rebozos as well as practice breathing, relaxation, and visualization techniques. A tour of the [facility] is included with this class. $20 per registrant and support person Childbirth Class- Weekend Option: This class is a Weekend version of our Birth & Baby series. It is designed for parents who have a difficult time fitting several weeks of classes into their schedule. It covers the care of your newborn and the basics of labor and childbirth. It also includes a [place] [facility] and lunch. The class is held two consecutive days:  beginning on Friday evening from 6:30 - 8:30 p.m. and the next day, Saturday from 9 a.m. - 4 p.m. (call for fee) Doren Custard Class: Interested in a waterbirth?  This informational class will help you discover whether waterbirth is the right fit for you. Education about waterbirth itself, supplies you would need and how to assemble your support team is what you can expect from this class. Some obstetrical practices require this class in order to pursue a waterbirth. (Not all obstetrical practices offer waterbirth-check with your healthcare provider.) Register only the expectant mom, but you are encouraged to bring your partner to class! Required if planning waterbirth, no fee. Infant/Child CPR:  Parents, grandparents, babysitters, and friends learn Cardio-Pulmonary Resuscitation skills for infants and children. You will also learn how to treat both conscious and unconscious choking in infants and children. This Family & Friends program does not offer certification. Register each participant individually to ensure that enough mannequins are available. (Call for fee) Grandparent Love: Expecting a grandbaby? This class is for you! Learn about the latest infant care and safety recommendations and ways to support your own child as he or she transitions into the parenting role. Taught by Registered Nurses who are childbirth instructors, but most importantly...they are grandmothers too! $10/person. Childbirth Class- Natural Childbirth: This series of 5 weekly classes is for expectant parents who want to learn and practice natural methods of coping with the process of labor and childbirth. Relaxation, breathing, massage, visualization, role of the partner, and helpful positioning are highlighted. Participants learn how to be confident in their body's ability to give birth. This class will empower and help parents make informed decisions about their own care. Includes discussion that will help new parents transition into the immediate postpartum period. [facility] is included. We suggest taking this class between 25-32 weeks, but it's only a recommendation. $75 per registrant and one support person or $30 Medicaid. Childbirth Class- 3 week Series: This option of 3 weekly classes helps you and your labor partner prepare for childbirth. Newborn care, labor & birth, cesarean birth, pain management, and comfort techniques are discussed and a [place] [facility] is included. The class meets at the same time, on the same day of the week for 3 consecutive weeks beginning with the starting date you choose. $60 for registrant and one support person.    Marvelous Multiples: Expecting twins, triplets, or more? This class covers the differences in labor, birth, parenting, and breastfeeding issues that face multiples' parents. NICU tour is included. Led by a Certified Childbirth Educator who is the mother of twins. No fee. Caring for Baby: This class is for expectant and adoptive parents who want to learn and practice the most up-to-date newborn care for their babies. Focus is on birth through the first six weeks of life. Topics include feeding, bathing, diapering, crying, umbilical cord care, circumcision care and safe sleep. Parents learn to recognize symptoms of illness and when to call the pediatrician. Register only the mom-to-be and your partner or support person can plan to come with you! $10 per registrant and support person Childbirth Class- online option: This online class offers you the freedom to complete a Birth and Baby series in the comfort of your own home. The flexibility of this option allows you to review sections at your own pace, at times convenient to you and your support people. It includes additional video information, animations, quizzes, and extended activities. Get organized with helpful  eClass tools, checklists, and trackers. Once you register online for the class, you will receive an email within a few days to accept the invitation and begin the class when the time is right for you. The content will be available to you for 60 days. $60 for 60 days of online access for you and your support people.  Local Doulas: Natural Baby Doulas [email or web address] Tel: [phone] [email or web address] [employer] [phone] [email or web address] The Labor Hassell Halim  (also do waterbirth tub rental) [phone] [email or web address] [facility] Birth Doula [phone] [email or web address].fi [facility] Rhythms   [phone] [email or web address] [employer] (PADA) [email or web address] [place] Birth [facility] Baby  [email or web address] Considering Waterbirth? Guide for patients at [facility] for [employer]  Why consider waterbirth?  . Gentle birth for babies . Less pain medicine used in labor . May allow for passive descent/less pushing . May reduce perineal tears  . More mobility and instinctive maternal position changes . Increased maternal relaxation . Reduced blood pressure in labor  Is waterbirth safe? What are the risks of infection, drowning or other complications?  . Infection: o Very low risk (3.7 % for tub vs 4.8% for bed) o 7 in 8000 waterbirths with documented infection o Poorly cleaned equipment most common cause o Slightly lower group B strep transmission rate  . Drowning o Maternal:  - Very low risk   - Related to seizures or fainting o Newborn:  - Very low risk. No evidence of increased risk of respiratory problems in multiple large studies - Physiological protection from breathing under water - Avoid underwater birth if there are any fetal complications - Once baby's head is out of the water, keep it out.  . Birth complication o Some reports of cord trauma, but risk decreased by bringing baby to surface gradually o No evidence of increased risk of shoulder dystocia. Mothers can usually change positions faster in water than in a bed, possibly aiding the maneuvers to free the shoulder.   You must attend a Doren Custard class at [facility]  3rd Wednesday of every month from 7-9pm  [employer] by calling [phone] or online at [email or web address]  Bring [place] the certificate from the class to your prenatal appointment  Meet with a midwife at 36 weeks to see if you can still plan a waterbirth and to sign the consent.   Purchase or rent the following supplies:   Water  Birth Pool (Birth Pool in a Box or [place] for instance)  (Tubs start ~$125)  Single-use disposable tub liner designed for your brand of tub  New garden hose labeled "lead-free", "suitable for drinking water",  Electric drain pump to remove water (We recommend 792 gallon per hour or greater pump.)   Separate garden hose to remove the dirty water  Fish net  Bathing suit top (optional)  Long-handled mirror (optional)  Places to purchase or rent supplies  GotWebTools.is for tub purchases and supplies  Waterbirthsolutions.com for tub purchases and supplies  The Labor Ladies (www.[email or web address]) $275 for tub rental/set-up & take down/kit   [employer] (http://www.[place].com/.htm) Information regarding doulas (labor support) who provide pool rentals  Our practice has a Birth Pool in a Box tub at the hospital that you may borrow on a first-come-first-served basis. It is your responsibility to to set up, clean and break down the tub. We cannot guarantee the availability of this tub in advance. You are responsible for bringing all accessories listed above. If you do not have all necessary supplies you cannot  have a waterbirth.    Things that would prevent you from having a waterbirth:  Premature, <37wks  Previous cesarean birth  Presence of thick meconium-stained fluid  Multiple gestation (Twins, triplets, etc.)  Uncontrolled diabetes or gestational diabetes requiring medication  Hypertension requiring medication or diagnosis of pre-eclampsia  Heavy vaginal bleeding  Non-reassuring fetal heart rate  Active infection (MRSA, etc.). Group B Strep is NOT a contraindication for  waterbirth.  If your labor has to be induced and induction method requires continuous  monitoring of the baby's heart rate  Other risks/issues identified by your obstetrical provider  Please remember that birth is unpredictable. Under certain unforeseeable  circumstances your provider may advise against giving birth in the tub. These decisions will be made on a case-by-case basis and with the safety of you and your baby as our highest priority.

## 2017-12-12 NOTE — Progress Notes (Signed)
   PRENATAL VISIT NOTE  Subjective:  Elizabeth Odonnell is a 32 y.o. G2P1001 at 2653w1d being seen today for ongoing prenatal care.  She is currently monitored for the following issues for this low-risk pregnancy and has Supervision of other normal pregnancy, antepartum and Language barrier on their problem list.  Patient reports no complaints.  Contractions: Not present. Vag. Bleeding: None.  Movement: Present. Denies leaking of fluid.   The following portions of the patient's history were reviewed and updated as appropriate: allergies, current medications, past family history, past medical history, past social history, past surgical history and problem list. Problem list updated.  Objective:   Vitals:   12/12/17 1019  BP: 112/74  Pulse: 98  Weight: 141 lb 3.2 oz (64 kg)    Fetal Status: Fetal Heart Rate (bpm): 165 Fundal Height: 28 cm Movement: Present     General:  Alert, oriented and cooperative. Patient is in no acute distress.  Skin: Skin is warm and dry. No rash noted.   Cardiovascular: Normal heart rate noted  Respiratory: Normal respiratory effort, no problems with respiration noted  Abdomen: Soft, gravid, appropriate for gestational age.  Pain/Pressure: Present     Pelvic: Cervical exam deferred        Extremities: Normal range of motion.  Edema: None  Mental Status: Normal mood and affect. Normal behavior. Normal judgment and thought content.   Assessment and Plan:  Pregnancy: G2P1001 at 5553w1d  1. Encounter for supervision of other normal pregnancy in third trimester - Feeling well today, no complaints, continue routine care - Tdap vaccine greater than or equal to 7yo IM - 28 week labs drawn  2. Dizziness - Resolved  3. Pain of round ligament affecting pregnancy, antepartum - Confirmed patient is hydrating with water - Discussed sleeping in lateral position with pillow between legs  Medical interpreter present for all patient interaction  Preterm labor  symptoms and general obstetric precautions including but not limited to vaginal bleeding, contractions, leaking of fluid and fetal movement were reviewed in detail with the patient. Please refer to After Visit Summary for other counseling recommendations.  Return in about 2 weeks (around 12/26/2017).  Future Appointments  Date Time Provider Department Center  12/12/2017 11:15 AM Calvert CantorWeinhold, Elizabeth Odonnell, CNM [facility]    Calvert CantorSamantha Odonnell [name], [place]  12/12/17  10:36 AM

## 2017-12-13 LAB — GLUCOSE TOLERANCE, 2 HOURS W/ 1HR
GLUCOSE, 1 HOUR: 145 mg/dL (ref 65–179)
GLUCOSE, 2 HOUR: 127 mg/dL (ref 65–152)
Glucose, Fasting: 85 mg/dL (ref 65–91)

## 2017-12-13 LAB — CBC
HEMATOCRIT: 37.1 % (ref 34.0–46.6)
Hemoglobin: 12.4 g/dL (ref 11.1–15.9)
MCH: 27 pg (ref 26.6–33.0)
MCHC: 33.4 g/dL (ref 31.5–35.7)
MCV: 81 fL (ref 79–97)
Platelets: 222 10*3/uL (ref 150–450)
RBC: 4.59 x10E6/uL (ref 3.77–5.28)
RDW: 14.3 % (ref 12.3–15.4)
WBC: 9.1 10*3/uL (ref 3.4–10.8)

## 2017-12-13 LAB — HIV ANTIBODY (ROUTINE TESTING W REFLEX): HIV Screen 4th Generation wRfx: NONREACTIVE

## 2017-12-13 LAB — RPR: RPR: NONREACTIVE

## 2017-12-21 ENCOUNTER — Other Ambulatory Visit: Payer: Self-pay | Admitting: Obstetrics and Gynecology

## 2017-12-21 DIAGNOSIS — Z3201 Encounter for pregnancy test, result positive: Secondary | ICD-10-CM

## 2018-01-02 ENCOUNTER — Ambulatory Visit (INDEPENDENT_AMBULATORY_CARE_PROVIDER_SITE_OTHER): Payer: Medicaid Other | Admitting: Student

## 2018-01-02 VITALS — BP 101/68 | HR 94 | Wt 143.4 lb

## 2018-01-02 DIAGNOSIS — Z789 Other specified health status: Secondary | ICD-10-CM

## 2018-01-02 DIAGNOSIS — O219 Vomiting of pregnancy, unspecified: Secondary | ICD-10-CM

## 2018-01-02 DIAGNOSIS — Z3480 Encounter for supervision of other normal pregnancy, unspecified trimester: Secondary | ICD-10-CM

## 2018-01-02 MED ORDER — ONDANSETRON 4 MG PO TBDP: 4.0000 mg | ORAL_TABLET | Freq: Three times a day (TID) | ORAL | 0 refills | Status: DC | PRN

## 2018-01-02 NOTE — Patient Instructions (Signed)

## 2018-01-02 NOTE — Progress Notes (Signed)
   PRENATAL VISIT NOTE  Subjective:  Elizabeth Odonnell is a 32 y.o. G2P1001 at 9123w1d being seen today for ongoing prenatal care.  She is currently monitored for the following issues for this low-risk pregnancy and has Supervision of other normal pregnancy, antepartum and Language barrier on their problem list.  Patient reports no complaints.  Contractions: Not present. Vag. Bleeding: None.  Movement: (!) Decreased. Denies leaking of fluid. Reports feeling "normal" movement for at least 2 weeks. So far today has felt baby move at least 10 times. Also complaining of nausea for the last 3 days and requesting rx for nausea medication. No vomiting.   The following portions of the patient's history were reviewed and updated as appropriate: allergies, current medications, past family history, past medical history, past social history, past surgical history and problem list. Problem list updated.  Objective:   Vitals:   01/02/18 1113  BP: 101/68  Pulse: 94  Weight: 143 lb 6.4 oz (65 kg)    Fetal Status: Fetal Heart Rate (bpm): 148 Fundal Height: 31 cm Movement: (!) Decreased     General:  Alert, oriented and cooperative. Patient is in no acute distress.  Skin: Skin is warm and dry. No rash noted.   Cardiovascular: Normal heart rate noted  Respiratory: Normal respiratory effort, no problems with respiration noted  Abdomen: Soft, gravid, appropriate for gestational age.  Pain/Pressure: Present     Pelvic: Cervical exam deferred        Extremities: Normal range of motion.  Edema: None  Mental Status: Normal mood and affect. Normal behavior. Normal judgment and thought content.   Assessment and Plan:  Pregnancy: G2P1001 at 4223w1d  1. Supervision of other normal pregnancy, antepartum -doing well -reviewed kick counts -fetal movement heard & palpated when dopplered FHTs & during exam.   2. Language barrier -Arabic video interpreter used  3. Nausea and vomiting during pregnancy  -  ondansetron (ZOFRAN ODT) 4 MG disintegrating tablet; Take 1 tablet (4 mg total) by mouth every 8 (eight) hours as needed for nausea or vomiting.  Dispense: 15 tablet; Refill: 0  Preterm labor symptoms and general obstetric precautions including but not limited to vaginal bleeding, contractions, leaking of fluid and fetal movement were reviewed in detail with the patient. Please refer to After Visit Summary for other counseling recommendations.  Return in about 2 weeks (around 01/16/2018) for Routine OB.  Future Appointments  Date Time Provider Department Center  01/14/2018  8:15 AM Briant SitesWeinhold, [facility]  01/28/2018  9:15 AM Armando ReichertHogan, Heather D[facility]    Judeth HornErin Mayola Mcbain, NP

## 2018-01-14 ENCOUNTER — Ambulatory Visit (INDEPENDENT_AMBULATORY_CARE_PROVIDER_SITE_OTHER): Payer: Medicaid Other | Admitting: Advanced Practice Midwife

## 2018-01-14 VITALS — BP 111/71 | HR 94 | Wt 154.2 lb

## 2018-01-14 DIAGNOSIS — O219 Vomiting of pregnancy, unspecified: Secondary | ICD-10-CM

## 2018-01-14 DIAGNOSIS — Z3483 Encounter for supervision of other normal pregnancy, third trimester: Secondary | ICD-10-CM

## 2018-01-14 DIAGNOSIS — Z789 Other specified health status: Secondary | ICD-10-CM

## 2018-01-14 NOTE — Progress Notes (Signed)
   PRENATAL VISIT NOTE  Subjective:  Elizabeth Odonnell is a 32 y.o. G2P1001 at 711w6d being seen today for ongoing prenatal care.  She is currently monitored for the following issues for this low-risk pregnancy and has Supervision of other normal pregnancy, antepartum and Language barrier on their problem list.  Patient reports no complaints.  Contractions: Irritability. Vag. Bleeding: None.  Movement: Present. Denies leaking of fluid.   The following portions of the patient's history were reviewed and updated as appropriate: allergies, current medications, past family history, past medical history, past social history, past surgical history and problem list. Problem list updated.  Objective:   Vitals:   01/14/18 0828  BP: 111/71  Pulse: 94  Weight: 154 lb 3.2 oz (69.9 kg)    Fetal Status: Fetal Heart Rate (bpm): 140 Fundal Height: 33 cm Movement: Present     General:  Alert, oriented and cooperative. Patient is in no acute distress.  Skin: Skin is warm and dry. No rash noted.   Cardiovascular: Normal heart rate noted  Respiratory: Normal respiratory effort, no problems with respiration noted  Abdomen: Soft, gravid, appropriate for gestational age.  Pain/Pressure: Absent     Pelvic: Cervical exam deferred        Extremities: Normal range of motion.  Edema: None  Mental Status: Normal mood and affect. Normal behavior. Normal judgment and thought content.   Assessment and Plan:  Pregnancy: G2P1001 at 281w6d  1. Encounter for supervision of other normal pregnancy in third trimester --No complaints or concerns today, continue routine care --Discussed milestones for remaining visits --Confirmed patient knows location of MAU  2. Language barrier --Medical interpreter present for all patient interaction  3. Nausea and vomiting in pregnancy --Resolved  Preterm labor symptoms and general obstetric precautions including but not limited to vaginal bleeding, contractions,  leaking of fluid and fetal movement were reviewed in detail with the patient. Please refer to After Visit Summary for other counseling recommendations.  Return in about 2 weeks (around 01/28/2018).  Future Appointments  Date Time Provider Department Center  01/28/2018  9:15 AM Armando ReichertHogan, Heather D, CNM [facility]    Calvert CantorSamantha C Weinhold, [place]  01/14/18  8:39 AM

## 2018-01-14 NOTE — Patient Instructions (Signed)

## 2018-01-15 ENCOUNTER — Other Ambulatory Visit: Payer: Self-pay | Admitting: Obstetrics and Gynecology

## 2018-01-15 DIAGNOSIS — Z3201 Encounter for pregnancy test, result positive: Secondary | ICD-10-CM

## 2018-01-28 ENCOUNTER — Encounter: Payer: Self-pay | Admitting: Advanced Practice Midwife

## 2018-01-28 ENCOUNTER — Ambulatory Visit (INDEPENDENT_AMBULATORY_CARE_PROVIDER_SITE_OTHER): Payer: Medicaid Other | Admitting: Advanced Practice Midwife

## 2018-01-28 VITALS — BP 111/61 | Wt 144.0 lb

## 2018-01-28 DIAGNOSIS — Z3480 Encounter for supervision of other normal pregnancy, unspecified trimester: Secondary | ICD-10-CM

## 2018-01-28 DIAGNOSIS — Z3483 Encounter for supervision of other normal pregnancy, third trimester: Secondary | ICD-10-CM

## 2018-01-28 NOTE — Progress Notes (Signed)
   PRENATAL VISIT NOTE  Subjective:  Elizabeth Odonnell is a 32 y.o. G2P1001 at 7133w6d being seen today for ongoing prenatal care.  She is currently monitored for the following issues for this low-risk pregnancy and has Supervision of other normal pregnancy, antepartum and Language barrier on their problem list.  Patient reports some pain along left inguinal crease that occurs only when sitting up from a lying down position.  Contractions: Irritability. Vag. Bleeding: None.  Movement: Present. Denies leaking of fluid.   The following portions of the patient's history were reviewed and updated as appropriate: allergies, current medications, past family history, past medical history, past social history, past surgical history and problem list. Problem list updated.  Objective:   Vitals:   01/28/18 0919  BP: 111/61  Weight: 144 lb (65.3 kg)    Fetal Status: Fetal Heart Rate (bpm): 141 Fundal Height: 35 cm Movement: Present     General:  Alert, oriented and cooperative. Patient is in no acute distress.  Skin: Skin is warm and dry. No rash noted.   Cardiovascular: Normal heart rate noted  Respiratory: Normal respiratory effort, no problems with respiration noted  Abdomen: Soft, gravid, appropriate for gestational age.  Pain/Pressure: Present     Pelvic: Cervical exam deferred        Extremities: Normal range of motion.  Edema: None  Mental Status: Normal mood and affect. Normal behavior. Normal judgment and thought content.   Assessment and Plan:  Pregnancy: G2P1001 at 3933w6d  1. Encounter for supervision of other normal pregnancy in third trimester  - Educated on routine care for next visit  - Reviewed signs of early labor and warning signs  Preterm labor symptoms and general obstetric precautions including but not limited to vaginal bleeding, contractions, leaking of fluid and fetal movement were reviewed in detail with the patient. Please refer to After Visit Summary for  other counseling recommendations.  Return in about 2 weeks (around 02/11/2018).  No future appointments.  Margarita RanaHarrison D Ora Mcnatt, Student-PA

## 2018-01-28 NOTE — Progress Notes (Signed)
   PRENATAL VISIT NOTE  Subjective:  Elizabeth Odonnell is a 32 y.o. G2P1001 at 5223w6d being seen today for ongoing prenatal care.  She is currently monitored for the following issues for this low-risk pregnancy and has Supervision of other normal pregnancy, antepartum and Language barrier on their problem list.  Patient reports no complaints.  Contractions: Irritability. Vag. Bleeding: None.  Movement: Present. Denies leaking of fluid.   The following portions of the patient's history were reviewed and updated as appropriate: allergies, current medications, past family history, past medical history, past social history, past surgical history and problem list. Problem list updated.  Objective:   Vitals:   01/28/18 0919  BP: 111/61  Weight: 144 lb (65.3 kg)    Fetal Status: Fetal Heart Rate (bpm): 141 Fundal Height: 35 cm Movement: Present     General:  Alert, oriented and cooperative. Patient is in no acute distress.  Skin: Skin is warm and dry. No rash noted.   Cardiovascular: Normal heart rate noted  Respiratory: Normal respiratory effort, no problems with respiration noted  Abdomen: Soft, gravid, appropriate for gestational age.  Pain/Pressure: Present     Pelvic: Cervical exam deferred        Extremities: Normal range of motion.  Edema: None  Mental Status: Normal mood and affect. Normal behavior. Normal judgment and thought content.   Assessment and Plan:  Pregnancy: G2P1001 at 4423w6d  1. Encounter for supervision of other normal pregnancy in third trimester - Routine care  -GBS at next visit    Preterm labor symptoms and general obstetric precautions including but not limited to vaginal bleeding, contractions, leaking of fluid and fetal movement were reviewed in detail with the patient. Please refer to After Visit Summary for other counseling recommendations.  Return in about 2 weeks (around 02/11/2018).  No future appointments.  Thressa ShellerHeather Harmonee Tozer, CNM

## 2018-02-12 ENCOUNTER — Other Ambulatory Visit (HOSPITAL_COMMUNITY)
Admission: RE | Admit: 2018-02-12 | Discharge: 2018-02-12 | Disposition: A | Discharge status: Home / Self Care | Payer: Medicaid Other | Source: Ambulatory Visit | Attending: Advanced Practice Midwife | Admitting: Advanced Practice Midwife

## 2018-02-12 ENCOUNTER — Encounter: Payer: Self-pay | Admitting: Advanced Practice Midwife

## 2018-02-12 ENCOUNTER — Ambulatory Visit (INDEPENDENT_AMBULATORY_CARE_PROVIDER_SITE_OTHER): Payer: Medicaid Other | Admitting: Advanced Practice Midwife

## 2018-02-12 VITALS — BP 115/73 | HR 96 | Wt 148.4 lb

## 2018-02-12 DIAGNOSIS — Z3483 Encounter for supervision of other normal pregnancy, third trimester: Secondary | ICD-10-CM | POA: Insufficient documentation

## 2018-02-12 DIAGNOSIS — Z3480 Encounter for supervision of other normal pregnancy, unspecified trimester: Secondary | ICD-10-CM

## 2018-02-12 LAB — OB RESULTS CONSOLE GC/CHLAMYDIA: Gonorrhea: NEGATIVE

## 2018-02-12 LAB — OB RESULTS CONSOLE GBS: GBS: NEGATIVE

## 2018-02-12 NOTE — Progress Notes (Signed)
   PRENATAL VISIT NOTE  Subjective:  Elizabeth Odonnell is a 32 y.o. G2P1001 at [redacted]w[redacted]d being seen today for ongoing prenatal care.  She is currently monitored for the following issues for this low-risk pregnancy and has Supervision of other normal pregnancy, antepartum and Language barrier on their problem list.  Patient reports no complaints.  Contractions: Irregular. Vag. Bleeding: None.  Movement: Present. Denies leaking of fluid.   The following portions of the patient's history were reviewed and updated as appropriate: allergies, current medications, past family history, past medical history, past social history, past surgical history and problem list. Problem list updated.  Objective:   Vitals:   02/12/18 1004  BP: 115/73  Pulse: 96  Weight: 148 lb 6.4 oz (67.3 kg)    Fetal Status: Fetal Heart Rate (bpm): 148 Fundal Height: 37 cm Movement: Present     General:  Alert, oriented and cooperative. Patient is in no acute distress.  Skin: Skin is warm and dry. No rash noted.   Cardiovascular: Normal heart rate noted  Respiratory: Normal respiratory effort, no problems with respiration noted  Abdomen: Soft, gravid, appropriate for gestational age.  Pain/Pressure: Present     Pelvic: Cervical exam performed Dilation: Fingertip Effacement (%): Thick Station: Ballotable  Extremities: Normal range of motion.  Edema: None  Mental Status: Normal mood and affect. Normal behavior. Normal judgment and thought content.   Assessment and Plan:  Pregnancy: G2P1001 at [redacted]w[redacted]d  1. Supervision of other normal pregnancy, antepartum - Routine care - Culture, beta strep (group b only) - Cervicovaginal ancillary only  Term labor symptoms and general obstetric precautions including but not limited to vaginal bleeding, contractions, leaking of fluid and fetal movement were reviewed in detail with the patient. Please refer to After Visit Summary for other counseling recommendations.  Return in  about 1 week (around 02/19/2018).  No future appointments.  Thressa Sheller, CNM

## 2018-02-13 LAB — CERVICOVAGINAL ANCILLARY ONLY
Chlamydia: NEGATIVE
NEISSERIA GONORRHEA: NEGATIVE

## 2018-02-15 LAB — CULTURE, BETA STREP (GROUP B ONLY): STREP GP B CULTURE: NEGATIVE

## 2018-02-19 ENCOUNTER — Ambulatory Visit (INDEPENDENT_AMBULATORY_CARE_PROVIDER_SITE_OTHER): Payer: Medicaid Other | Admitting: Student

## 2018-02-19 DIAGNOSIS — Z3483 Encounter for supervision of other normal pregnancy, third trimester: Secondary | ICD-10-CM

## 2018-02-19 DIAGNOSIS — Z3480 Encounter for supervision of other normal pregnancy, unspecified trimester: Secondary | ICD-10-CM

## 2018-02-19 MED ORDER — LOPERAMIDE HCL 2 MG PO TABS: 2.0000 mg | ORAL_TABLET | Freq: Four times a day (QID) | ORAL | 0 refills | Status: DC | PRN

## 2018-02-19 NOTE — Patient Instructions (Signed)
Food Choices to Help Relieve Diarrhea, Adult  When you have diarrhea, the foods you eat and your eating habits are very important. Choosing the right foods and drinks can help:  · Relieve diarrhea.  · Replace lost fluids and nutrients.  · Prevent dehydration.    What general guidelines should I follow?  Relieving diarrhea  · Choose foods with less than 2 g or .07 oz. of fiber per serving.  · Limit fats to less than 8 tsp (38 g or 1.34 oz.) a day.  · Avoid the following:  ? Foods and beverages sweetened with high-fructose corn syrup, honey, or sugar alcohols such as xylitol, sorbitol, and mannitol.  ? Foods that contain a lot of fat or sugar.  ? Fried, greasy, or spicy foods.  ? High-fiber grains, breads, and cereals.  ? Raw fruits and vegetables.  · Eat foods that are rich in probiotics. These foods include dairy products such as yogurt and fermented milk products. They help increase healthy bacteria in the stomach and intestines (gastrointestinal tract, or GI tract).  · If you have lactose intolerance, avoid dairy products. These may make your diarrhea worse.  · Take medicine to help stop diarrhea (antidiarrheal medicine) only as told by your health care provider.  Replacing nutrients  · Eat small meals or snacks every 3–4 hours.  · Eat bland foods, such as white rice, toast, or baked potato, until your diarrhea starts to get better. Gradually reintroduce nutrient-rich foods as tolerated or as told by your health care provider. This includes:  ? Well-cooked protein foods.  ? Peeled, seeded, and soft-cooked fruits and vegetables.  ? Low-fat dairy products.  · Take vitamin and mineral supplements as told by your health care provider.  Preventing dehydration    · Start by sipping water or a special solution to prevent dehydration (oral rehydration solution, ORS). Urine that is clear or pale yellow means that you are getting enough fluid.  · Try to drink at least 8–10 cups of fluid each day to help replace lost  fluids.  · You may add other liquids in addition to water, such as clear juice or decaffeinated sports drinks, as tolerated or as told by your health care provider.  · Avoid drinks with caffeine, such as coffee, tea, or soft drinks.  · Avoid alcohol.  What foods are recommended?  The items listed may not be a complete list. Talk with your health care provider about what dietary choices are best for you.  Grains  White rice. White, French, or pita breads (fresh or toasted), including plain rolls, buns, or bagels. White pasta. Saltine, soda, or graham crackers. Pretzels. Low-fiber cereal. Cooked cereals made with water (such as cornmeal, farina, or cream cereals). Plain muffins. Matzo. Melba toast. Zwieback.  Vegetables  Potatoes (without the skin). Most well-cooked and canned vegetables without skins or seeds. Tender lettuce.  Fruits  Apple sauce. Fruits canned in juice. Cooked apricots, cherries, grapefruit, peaches, pears, or plums. Fresh bananas and cantaloupe.  Meats and other protein foods  Baked or boiled chicken. Eggs. Tofu. Fish. Seafood. Smooth nut butters. Ground or well-cooked tender beef, ham, veal, lamb, pork, or poultry.  Dairy  Plain yogurt, kefir, and unsweetened liquid yogurt. Lactose-free milk, buttermilk, skim milk, or soy milk. Low-fat or nonfat hard cheese.  Beverages  Water. Low-calorie sports drinks. Fruit juices without pulp. Strained tomato and vegetable juices. Decaffeinated teas. Sugar-free beverages not sweetened with sugar alcohols. Oral rehydration solutions, if approved by your health care   provider.  Seasoning and other foods  Bouillon, broth, or soups made from recommended foods.  What foods are not recommended?  The items listed may not be a complete list. Talk with your health care provider about what dietary choices are best for you.  Grains  Whole grain, whole wheat, bran, or rye breads, rolls, pastas, and crackers. Wild or brown rice. Whole grain or bran cereals. Barley. Oats  and oatmeal. Corn tortillas or taco shells. Granola. Popcorn.  Vegetables  Raw vegetables. Fried vegetables. Cabbage, broccoli, Brussels sprouts, artichokes, baked beans, beet greens, corn, kale, legumes, peas, sweet potatoes, and yams. Potato skins. Cooked spinach and cabbage.  Fruits  Dried fruit, including raisins and dates. Raw fruits. Stewed or dried prunes. Canned fruits with syrup.  Meat and other protein foods  Fried or fatty meats. Deli meats. Chunky nut butters. Nuts and seeds. Beans and lentils. Bacon. Hot dogs. Sausage.  Dairy  High-fat cheeses. Whole milk, chocolate milk, and beverages made with milk, such as milk shakes. Half-and-half. Cream. sour cream. Ice cream.  Beverages  Caffeinated beverages (such as coffee, tea, soda, or energy drinks). Alcoholic beverages. Fruit juices with pulp. Prune juice. Soft drinks sweetened with high-fructose corn syrup or sugar alcohols. High-calorie sports drinks.  Fats and oils  Butter. Cream sauces. Margarine. Salad oils. Plain salad dressings. Olives. Avocados. Mayonnaise.  Sweets and desserts  Sweet rolls, doughnuts, and sweet breads. Sugar-free desserts sweetened with sugar alcohols such as xylitol and sorbitol.  Seasoning and other foods  Honey. Hot sauce. Chili powder. Gravy. Cream-based or milk-based soups. Pancakes and waffles.  Summary  · When you have diarrhea, the foods you eat and your eating habits are very important.  · Make sure you get at least 8–10 cups of fluid each day, or enough to keep your urine clear or pale yellow.  · Eat bland foods and gradually reintroduce healthy, nutrient-rich foods as tolerated, or as told by your health care provider.  · Avoid high-fiber, fried, greasy, or spicy foods.  This information is not intended to replace advice given to you by your health care provider. Make sure you discuss any questions you have with your health care provider.  Document Released: 08/19/2003 Document Revised: 05/26/2016 Document  Reviewed: 05/26/2016  Elsevier Interactive Patient Education © 2018 [employer]

## 2018-02-19 NOTE — Progress Notes (Signed)
Pt states has diarrhea.

## 2018-02-19 NOTE — Progress Notes (Signed)
Patient ID: Elizabeth Odonnell, female   DOB: [birth year], 32 y.o.   MRN: [id number]   PRENATAL VISIT NOTE  Subjective:  Elizabeth Odonnell is a 32 y.o. G2P1001 at [redacted]w[redacted]d being seen today for ongoing prenatal care.  She is currently monitored for the following issues for this low-risk pregnancy and has Supervision of other normal pregnancy, antepartum and Language barrier on their problem list.  Patient reports   Contractions: Irritability. Vag. Bleeding: None.  Movement: Present. Denies leaking of fluid.   The following portions of the patient's history were reviewed and updated as appropriate: allergies, current medications, past family history, past medical history, past social history, past surgical history and problem list. Problem list updated.  Objective:   Vitals:   02/19/18 1028  BP: 115/69  Pulse: 89  Weight: 148 lb 9.6 oz (67.4 kg)    Fetal Status:     Movement: Present     General:  Alert, oriented and cooperative. Patient is in no acute distress.  Skin: Skin is warm and dry. No rash noted.   Cardiovascular: Normal heart rate noted  Respiratory: Normal respiratory effort, no problems with respiration noted  Abdomen: Soft, gravid, appropriate for gestational age.  Pain/Pressure: Absent     Pelvic: Cervical exam deferred        Extremities: Normal range of motion.  Edema: None  Mental Status: Normal mood and affect. Normal behavior. Normal judgment and thought content.   Assessment and Plan:  Pregnancy: G2P1001 at [redacted]w[redacted]d  1. Supervision of other normal pregnancy, antepartum -Reviewed warning signs and when to return to MAU; bedside interpreter present  Term labor symptoms and general obstetric precautions including but not limited to vaginal bleeding, contractions, leaking of fluid and fetal movement were reviewed in detail with the patient. Please refer to After Visit Summary for other counseling recommendations.  Return in about 1 week (around  02/26/2018), or lrob.  No future appointments.  Marylene Land, CNM

## 2018-02-26 ENCOUNTER — Other Ambulatory Visit: Payer: Self-pay | Admitting: Student

## 2018-02-26 ENCOUNTER — Telehealth: Payer: Self-pay

## 2018-02-26 ENCOUNTER — Ambulatory Visit (INDEPENDENT_AMBULATORY_CARE_PROVIDER_SITE_OTHER): Payer: Medicaid Other | Admitting: Student

## 2018-02-26 DIAGNOSIS — Z3480 Encounter for supervision of other normal pregnancy, unspecified trimester: Secondary | ICD-10-CM

## 2018-02-26 NOTE — Progress Notes (Signed)
Induction scheduled for 03/12/18 @ 6:30am.

## 2018-02-26 NOTE — Progress Notes (Addendum)
    PRENATAL VISIT NOTE  Subjective:  Elizabeth Odonnell is a 32 y.o. G2P1001 at 1063w0d being seen today for ongoing prenatal care.  She is currently monitored for the following issues for this low-risk pregnancy and has Supervision of other normal pregnancy, antepartum and Language barrier on their problem list.  Patient reports no complaints.  Contractions: Irritability. Vag. Bleeding: None.  Movement: Present. Denies leaking of fluid.   The following portions of the patient's history were reviewed and updated as appropriate: allergies, current medications, past family history, past medical history, past social history, past surgical history and problem list. Problem list updated.  Objective:   Vitals:   02/26/18 1002  BP: 112/73  Pulse: 98  Weight: 149 lb 1.6 oz (67.6 kg)    Fetal Status: Fetal Heart Rate (bpm): 131 Fundal Height: 39 cm Movement: Present     General:  Alert, oriented and cooperative. Patient is in no acute distress.  Skin: Skin is warm and dry. No rash noted.   Cardiovascular: Normal heart rate noted  Respiratory: Normal respiratory effort, no problems with respiration noted  Abdomen: Soft, gravid, appropriate for gestational age.  Pain/Pressure: Present     Pelvic: Cervical exam performed Dilation: Fingertip Effacement (%): Thick    Extremities: Normal range of motion.  Edema: None  Mental Status: Normal mood and affect. Normal behavior. Normal judgment and thought content.   Assessment and Plan:  Pregnancy: G2P1001 at 7963w0d  1. Supervision of other normal pregnancy, antepartum -Doing well; reviewed signs of Labor and when to come to MAU. Patient still wants to use OCP for birth control.  -Induction to be scheduled; will place orders.  -Patient to return ten days for NST and LROB visit.   Term labor symptoms and general obstetric precautions including but not limited to vaginal bleeding, contractions, leaking of fluid and fetal movement were reviewed  in detail with the patient. Please refer to After Visit Summary for other counseling recommendations.  Return in about 10 days (around 03/08/2018), or LROb and NST.  Future Appointments  Date Time Provider Department Center  03/05/2018 10:15 AM Madlyn FrankelKooistra, Kathryn Lorraine, [facility]  03/05/2018 11:15 AM [facility] NST [facility]    Marylene LandKathryn Lorraine [place]ooistra, [place]

## 2018-02-26 NOTE — Telephone Encounter (Signed)
[facility] pt with [facility]pre[facility] id# [phone] to let pt know of Induction dates 03/12/18 @ 6:30am.No answer, left VM.

## 2018-02-27 ENCOUNTER — Telehealth (HOSPITAL_COMMUNITY): Payer: Self-pay | Admitting: *Deleted

## 2018-02-27 NOTE — Telephone Encounter (Signed)
Preadmission screen  

## 2018-02-28 ENCOUNTER — Other Ambulatory Visit: Payer: Self-pay | Admitting: Student

## 2018-03-01 ENCOUNTER — Encounter: Payer: Self-pay | Admitting: *Deleted

## 2018-03-05 ENCOUNTER — Ambulatory Visit (INDEPENDENT_AMBULATORY_CARE_PROVIDER_SITE_OTHER): Payer: Medicaid Other | Admitting: Student

## 2018-03-05 ENCOUNTER — Other Ambulatory Visit: Payer: Medicaid Other

## 2018-03-05 VITALS — BP 114/77 | HR 87 | Wt 148.0 lb

## 2018-03-05 DIAGNOSIS — Z3480 Encounter for supervision of other normal pregnancy, unspecified trimester: Secondary | ICD-10-CM

## 2018-03-05 NOTE — Progress Notes (Signed)
   PRENATAL VISIT NOTE  Subjective:  Elizabeth Odonnell is a 32 y.o. G2P1001 at 1074w0d being seen today for ongoing prenatal care.  She is currently monitored for the following issues for this low-risk pregnancy and has Supervision of other normal pregnancy, antepartum and Language barrier on their problem list.  Patient reports no complaints.  Contractions: Irregular. Vag. Bleeding: None.  Movement: Present. Denies leaking of fluid.   The following portions of the patient's history were reviewed and updated as appropriate: allergies, current medications, past family history, past medical history, past social history, past surgical history and problem list. Problem list updated.  Objective:   Vitals:   03/05/18 1030  BP: 114/77  Pulse: 87  Weight: 148 lb (67.1 kg)    Fetal Status: Fetal Heart Rate (bpm): 140 Fundal Height: 39 cm Movement: Present     General:  Alert, oriented and cooperative. Patient is in no acute distress.  Skin: Skin is warm and dry. No rash noted.   Cardiovascular: Normal heart rate noted  Respiratory: Normal respiratory effort, no problems with respiration noted  Abdomen: Soft, gravid, appropriate for gestational age.  Pain/Pressure: Present     Pelvic: Cervical exam deferred        Extremities: Normal range of motion.  Edema: None  Mental Status: Normal mood and affect. Normal behavior. Normal judgment and thought content.   Assessment and Plan:  Pregnancy: G2P1001 at 5174w0d 1. Patient doing well; explained to her the purpose of NST and BPP on Thursday; emphasized that she needs to keep that appointment.   Term labor symptoms and general obstetric precautions including but not limited to vaginal bleeding, contractions, leaking of fluid and fetal movement were reviewed in detail with the patient. Please refer to After Visit Summary for other counseling recommendations.  No follow-ups on file.  Future Appointments  Date Time Provider Department Center   03/07/2018  2:15 PM [facility] NST [facility]  03/12/2018  6:30 AM [facility] ROOM [facility] None    Marylene LandKathryn Lorraine Kooistra, CNM

## 2018-03-05 NOTE — Patient Instructions (Addendum)
Explained Fetal kick counts and how to monitor baby movement (cold drink, lie on left side, count movements in 2 hours ). If she doesn't get 10 movements in 2 hours she should come to MAU.

## 2018-03-07 ENCOUNTER — Other Ambulatory Visit: Payer: Medicaid Other

## 2018-03-08 ENCOUNTER — Ambulatory Visit: Payer: Self-pay

## 2018-03-08 ENCOUNTER — Ambulatory Visit (INDEPENDENT_AMBULATORY_CARE_PROVIDER_SITE_OTHER): Payer: Medicaid Other | Admitting: *Deleted

## 2018-03-08 VITALS — BP 113/73 | HR 82 | Wt 150.0 lb

## 2018-03-08 DIAGNOSIS — O480 Post-term pregnancy: Secondary | ICD-10-CM

## 2018-03-08 NOTE — Progress Notes (Signed)
Pt informed that the ultrasound is considered a limited OB ultrasound and is not intended to be a complete ultrasound exam.  Patient also informed that the ultrasound is not being completed with the intent of assessing for fetal or placental anomalies or any pelvic abnormalities.  Explained that the purpose of today's ultrasound is to assess for presentation, BPP and amniotic fluid volume.  Patient acknowledges the purpose of the exam and the limitations of the study.    IOL scheduled 10/1 @ 0630

## 2018-03-08 NOTE — Progress Notes (Signed)
NST:  Baseline: 130 bpm, Variability: Good {> 6 bpm), Accelerations: Reactive and Decelerations: Absent   

## 2018-03-11 ENCOUNTER — Other Ambulatory Visit: Payer: Medicaid Other

## 2018-03-12 ENCOUNTER — Inpatient Hospital Stay (HOSPITAL_COMMUNITY): Payer: Medicaid Other | Admitting: Anesthesiology

## 2018-03-12 ENCOUNTER — Inpatient Hospital Stay (HOSPITAL_COMMUNITY)
Admission: RE | Admit: 2018-03-12 | Discharge: 2018-03-14 | DRG: 768 | Disposition: A | Discharge status: Home / Self Care | Payer: Medicaid Other | Attending: Family Medicine | Admitting: Family Medicine

## 2018-03-12 ENCOUNTER — Other Ambulatory Visit: Payer: Self-pay

## 2018-03-12 ENCOUNTER — Encounter (HOSPITAL_COMMUNITY): Payer: Self-pay

## 2018-03-12 DIAGNOSIS — Z789 Other specified health status: Secondary | ICD-10-CM | POA: Diagnosis present

## 2018-03-12 DIAGNOSIS — O480 Post-term pregnancy: Principal | ICD-10-CM | POA: Diagnosis present

## 2018-03-12 DIAGNOSIS — Z23 Encounter for immunization: Secondary | ICD-10-CM

## 2018-03-12 DIAGNOSIS — Z3A41 41 weeks gestation of pregnancy: Secondary | ICD-10-CM

## 2018-03-12 DIAGNOSIS — Z3480 Encounter for supervision of other normal pregnancy, unspecified trimester: Secondary | ICD-10-CM

## 2018-03-12 HISTORY — DX: Other specified health status: Z78.9

## 2018-03-12 LAB — TYPE AND SCREEN
ABO/RH(D): O POS
ANTIBODY SCREEN: NEGATIVE

## 2018-03-12 LAB — CBC
HCT: 39.1 % (ref 36.0–46.0)
Hemoglobin: 13.1 g/dL (ref 12.0–15.0)
MCH: 27.9 pg (ref 26.0–34.0)
MCHC: 33.5 g/dL (ref 30.0–36.0)
MCV: 83.4 fL (ref 78.0–100.0)
PLATELETS: 160 10*3/uL (ref 150–400)
RBC: 4.69 MIL/uL (ref 3.87–5.11)
RDW: 14.3 % (ref 11.5–15.5)
WBC: 9.3 10*3/uL (ref 4.0–10.5)

## 2018-03-12 LAB — ABO/RH: ABO/RH(D): O POS

## 2018-03-12 MED ORDER — OXYTOCIN 40 UNITS IN LACTATED RINGERS INFUSION - SIMPLE MED
1.0000 m[IU]/min | INTRAVENOUS | Status: DC
  Administered 2018-03-12: 2 m[IU]/min via INTRAVENOUS
  Filled 2018-03-12: qty 1000

## 2018-03-12 MED ORDER — PHENYLEPHRINE 40 MCG/ML (10ML) SYRINGE FOR IV PUSH (FOR BLOOD PRESSURE SUPPORT)
80.0000 ug | PREFILLED_SYRINGE | INTRAVENOUS | Status: DC | PRN
  Administered 2018-03-12: 80 ug via INTRAVENOUS
  Filled 2018-03-12: qty 5

## 2018-03-12 MED ORDER — ONDANSETRON HCL 4 MG/2ML IJ SOLN: 4.0000 mg | Freq: Four times a day (QID) | INTRAMUSCULAR | Status: DC | PRN

## 2018-03-12 MED ORDER — PHENYLEPHRINE 40 MCG/ML (10ML) SYRINGE FOR IV PUSH (FOR BLOOD PRESSURE SUPPORT): 80.0000 ug | PREFILLED_SYRINGE | INTRAVENOUS | Status: DC | PRN

## 2018-03-12 MED ORDER — LIDOCAINE HCL (PF) 1 % IJ SOLN
INTRAMUSCULAR | Status: DC | PRN
  Administered 2018-03-12: 5 mL via EPIDURAL

## 2018-03-12 MED ORDER — OXYTOCIN BOLUS FROM INFUSION
500.0000 mL | Freq: Once | INTRAVENOUS | Status: AC
  Administered 2018-03-12: 500 mL via INTRAVENOUS

## 2018-03-12 MED ORDER — LIDOCAINE HCL (PF) 1 % IJ SOLN
30.0000 mL | INTRAMUSCULAR | Status: DC | PRN
  Administered 2018-03-12: 30 mL via SUBCUTANEOUS
  Filled 2018-03-12 (×2): qty 30

## 2018-03-12 MED ORDER — MISOPROSTOL 25 MCG QUARTER TABLET
25.0000 ug | ORAL_TABLET | ORAL | Status: DC | PRN
  Administered 2018-03-12: 25 ug via VAGINAL
  Filled 2018-03-12 (×2): qty 1

## 2018-03-12 MED ORDER — OXYCODONE-ACETAMINOPHEN 5-325 MG PO TABS: 2.0000 | ORAL_TABLET | ORAL | Status: DC | PRN

## 2018-03-12 MED ORDER — LACTATED RINGERS IV SOLN
500.0000 mL | INTRAVENOUS | Status: DC | PRN
  Administered 2018-03-12: 1000 mL via INTRAVENOUS

## 2018-03-12 MED ORDER — LACTATED RINGERS IV SOLN: 500.0000 mL | Freq: Once | INTRAVENOUS | Status: DC

## 2018-03-12 MED ORDER — TERBUTALINE SULFATE 1 MG/ML IJ SOLN
0.2500 mg | Freq: Once | INTRAMUSCULAR | Status: DC | PRN
  Filled 2018-03-12: qty 1

## 2018-03-12 MED ORDER — EPHEDRINE 5 MG/ML INJ: 10.0000 mg | INTRAVENOUS | Status: DC | PRN

## 2018-03-12 MED ORDER — ACETAMINOPHEN 325 MG PO TABS: 650.0000 mg | ORAL_TABLET | ORAL | Status: DC | PRN

## 2018-03-12 MED ORDER — OXYTOCIN 40 UNITS IN LACTATED RINGERS INFUSION - SIMPLE MED
2.5000 [IU]/h | INTRAVENOUS | Status: DC
  Administered 2018-03-12: 2.5 [IU]/h via INTRAVENOUS

## 2018-03-12 MED ORDER — PHENYLEPHRINE 40 MCG/ML (10ML) SYRINGE FOR IV PUSH (FOR BLOOD PRESSURE SUPPORT)
80.0000 ug | PREFILLED_SYRINGE | INTRAVENOUS | Status: DC | PRN
  Filled 2018-03-12: qty 5
  Filled 2018-03-12: qty 10

## 2018-03-12 MED ORDER — SOD CITRATE-CITRIC ACID 500-334 MG/5ML PO SOLN: 30.0000 mL | ORAL | Status: DC | PRN

## 2018-03-12 MED ORDER — EPHEDRINE 5 MG/ML INJ
10.0000 mg | INTRAVENOUS | Status: DC | PRN
  Filled 2018-03-12: qty 2

## 2018-03-12 MED ORDER — DIPHENHYDRAMINE HCL 50 MG/ML IJ SOLN: 12.5000 mg | INTRAMUSCULAR | Status: DC | PRN

## 2018-03-12 MED ORDER — LACTATED RINGERS IV SOLN
INTRAVENOUS | Status: DC
  Administered 2018-03-12 (×4): via INTRAVENOUS

## 2018-03-12 MED ORDER — OXYCODONE-ACETAMINOPHEN 5-325 MG PO TABS: 1.0000 | ORAL_TABLET | ORAL | Status: DC | PRN

## 2018-03-12 MED ORDER — FENTANYL 2.5 MCG/ML BUPIVACAINE 1/10 % EPIDURAL INFUSION (WH - ANES)
14.0000 mL/h | INTRAMUSCULAR | Status: DC | PRN
  Administered 2018-03-12: 14 mL/h via EPIDURAL
  Filled 2018-03-12: qty 100

## 2018-03-12 NOTE — Anesthesia Pain Management Evaluation Note (Signed)
  [facility] Pain Management Visit Note  Patient: Elizabeth Odonnell, 32 y.o., female  "Hello I am a member of the anesthesia team at [facility]. We have an anesthesia team available at all times to provide care throughout the hospital, including epidural management and anesthesia for C-section. I don't know your plan for the delivery whether it a natural birth, water birth, IV sedation, nitrous supplementation, doula or epidural, but we want to meet your pain goals."   1.Was your pain managed to your expectations on prior hospitalizations?   No   2.What is your expectation for pain management during this hospitalization?     Epidural  3.How can we help you reach that goal? The patient states that she dilated to 10 cm without much discomfort and delivered within 30 minutes with her first baby. She is concerned how the induction is going to work with this delivery and the timing of the epidural. Record the patient's initial score and the patient's pain goal.   Pain: 3  Pain Goal: 7 [facility] wants you to be able to say your pain was always managed very well.  Dawson Albers 03/12/2018

## 2018-03-12 NOTE — Progress Notes (Signed)
Interpreter service inconsistent on interpreter service.  Pt has family member coming to be with her that will be available if needed.  Phone service used to admit pt.  Pt requesting to eat.  MD notified, pt may have breakfast.

## 2018-03-12 NOTE — Anesthesia Procedure Notes (Signed)
Epidural Patient location during procedure: OB Start time: 03/12/2018 5:58 PM End time: 03/12/2018 6:21 PM  Staffing Anesthesiologist: Trevor Iha, MD Performed: anesthesiologist   Preanesthetic Checklist Completed: patient identified, site marked, surgical consent, pre-op evaluation, timeout performed, IV checked, risks and benefits discussed and monitors and equipment checked  Epidural Patient position: sitting Prep: site prepped and draped and DuraPrep Patient monitoring: continuous pulse ox and blood pressure Approach: midline Location: L3-L4 Injection technique: LOR air  Needle:  Needle type: Tuohy  Needle gauge: 17 G Needle length: 9 cm and 9 Needle insertion depth: 6 cm Catheter type: closed end flexible Catheter size: 19 Gauge Catheter at skin depth: 12 cm Test dose: negative  Assessment Events: blood not aspirated, injection not painful, no injection resistance, negative IV test and no paresthesia  Additional Notes 1 attempt , Pt tolerated procedure well

## 2018-03-12 NOTE — Anesthesia Preprocedure Evaluation (Signed)
Anesthesia Evaluation  Patient identified by MRN, date of birth, ID band Patient awake    Reviewed: Allergy & Precautions, NPO status , Patient's Chart, lab work & pertinent test results  Airway Mallampati: II  TM Distance: >3 FB Neck ROM: Full    Dental no notable dental hx.    Pulmonary neg pulmonary ROS,    Pulmonary exam normal breath sounds clear to auscultation       Cardiovascular Exercise Tolerance: Good negative cardio ROS Normal cardiovascular exam Rhythm:Regular Rate:Normal     Neuro/Psych negative neurological ROS  negative psych ROS   GI/Hepatic   Endo/Other    Renal/GU      Musculoskeletal   Abdominal   Peds  Hematology   Anesthesia Other Findings Arabic Speaking Gestational thrombocytopenia  Reproductive/Obstetrics                             Anesthesia Physical Anesthesia Plan  ASA: II  Anesthesia Plan: Epidural   Post-op Pain Management:    Induction:   PONV Risk Score and Plan:   Airway Management Planned:   Additional Equipment:   Intra-op Plan:   Post-operative Plan:   Informed Consent: I have reviewed the patients History and Physical, chart, labs and discussed the procedure including the risks, benefits and alternatives for the proposed anesthesia with the patient or authorized representative who has indicated his/her understanding and acceptance.     Plan Discussed with: CRNA  Anesthesia Plan Comments:         Anesthesia Quick Evaluation

## 2018-03-12 NOTE — Progress Notes (Signed)
LABOR PROGRESS NOTE  Elizabeth Odonnell Elizabeth Odonnell is a 32 y.o. G2P1001 at [redacted]w[redacted]d  admitted for IOL for post dates.   Subjective: Patient doing well, no complaints currently.   Objective: BP (!) 106/59   Pulse 96   Temp 97.7 F (36.5 C) (Oral)   LMP 05/29/2017 (Exact Date)  or  Vitals:   03/12/18 0915 03/12/18 1456 03/12/18 1606  BP:  (!) 96/55 (!) 106/59  Pulse:  81 96  Temp: 97.7 F (36.5 C)    TempSrc: Oral      Dilation: 4 Effacement (%): 70 Station: -1 Presentation: Vertex Exam by:: J.Cox, RN FHT: baseline rate 130s, moderate varibility, +acel, -decel Toco: every 2-3 min   Labs: Lab Results  Component Value Date   WBC 9.3 03/12/2018   HGB 13.1 03/12/2018   HCT 39.1 03/12/2018   MCV 83.4 03/12/2018   PLT 160 03/12/2018    Patient Active Problem List   Diagnosis Date Noted  . Indication for care in labor and delivery, antepartum 03/12/2018  . Supervision of other normal pregnancy, antepartum 08/22/2017  . Language barrier 08/22/2017    Assessment / Plan: 32 y.o. G2P1001 at [redacted]w[redacted]d here for IOL for post-dates. GBS negative.   Labor: Cytotec x1. Start Pitocin 2x2, monitor in 4 hours   Fetal Wellbeing:  Cat 1 strip Pain Control:  Epidural for active labor  Anticipated MOD:  NSVD   Leticia Penna, D.O. Family Medicine PGY-1  03/12/2018, 4:58 PM

## 2018-03-12 NOTE — H&P (Addendum)
LABOR AND DELIVERY ADMISSION HISTORY AND PHYSICAL NOTE  Elizabeth Odonnell is a 32 y.o. female G2P1001 with IUP at [redacted]w[redacted]d by LMP/13 wk [place] presenting for IOL for postdates.  She reports positive fetal movement. She denies leakage of fluid or vaginal bleeding.   Prenatal History/Complications: PNC at [facility] Pregnancy complications: None   Past Medical History: Past Medical History:  Diagnosis Date  . Medical history non-contributory     Past Surgical History: History reviewed. No pertinent surgical history.  Obstetrical History: OB History    Gravida  2   Para  1   Term  1   Preterm      AB      Living  1     SAB      TAB      Ectopic      Multiple      Live Births  1           Social History: Social History   Socioeconomic History  . Marital status: Married    Spouse name: Not on file  . Number of children: Not on file  . Years of education: Not on file  . Highest education level: Not on file  Occupational History  . Not on file  Social Needs  . Financial resource strain: Not on file  . Food insecurity:    Worry: Not on file    Inability: Not on file  . Transportation needs:    Medical: Not on file    Non-medical: Not on file  Tobacco Use  . Smoking status: Never Smoker  . Smokeless tobacco: Never Used  Substance and Sexual Activity  . Alcohol use: No    Frequency: Never  . Drug use: No  . Sexual activity: Yes    Birth control/protection: None  Lifestyle  . Physical activity:    Days per week: Not on file    Minutes per session: Not on file  . Stress: Not on file  Relationships  . Social connections:    Talks on phone: Not on file    Gets together: Not on file    Attends religious service: Not on file    Active member of club or organization: Not on file    Attends meetings of clubs or organizations: Not on file    Relationship status: Not on file  Other Topics Concern  . Not on file  Social History Narrative  . Not on  file    Family History: Family History  Problem Relation Age of Onset  . Diabetes Mother     Allergies: Allergies  Allergen Reactions  . Other Itching    Eggplant--itching in and around mouth    Medications Prior to Admission  Medication Sig Dispense Refill Last Dose  . loperamide (IMODIUM A-D) 2 MG tablet Take 1 tablet (2 mg total) by mouth 4 (four) times daily as needed for diarrhea or loose stools. (Patient not taking: Reported on 02/26/2018) 30 tablet 0 Not Taking  . ondansetron (ZOFRAN ODT) 4 MG disintegrating tablet Take 1 tablet (4 mg total) by mouth every 8 (eight) hours as needed for nausea or vomiting. (Patient not taking: Reported on 01/28/2018) 15 tablet 0 Not Taking  . Prenat w/o A Vit-FeFum-FePo-FA (CONCEPT OB) 130-92.4-1 MG CAPS Take 1 tablet by mouth daily. 30 capsule 12 Taking  . Prenatal Vit-Fe Fumarate-FA (PREPLUS) 27-1 MG TABS TK 1 T PO  D  2 Taking     Review of Systems  All  systems reviewed and negative except as stated in HPI  Physical Exam Temperature 97.7 F (36.5 C), temperature source Oral, last menstrual period 05/29/2017. General appearance: alert, oriented, NAD Lungs: normal respiratory effort Heart: regular rate Abdomen: soft, non-tender; gravid, FH appropriate for GA Extremities: No calf swelling or tenderness Presentation: cephalic per RN evaluation  Fetal monitoring: Reactive, Baseline ~150's, Moderate variability, + accel, - decels  Uterine activity: Irregular contractions, varying every 3-6+ minutes  Cervical exam performed by Polos RN: 3/thick/-3     Prenatal labs: ABO, Rh: O/Positive/-- (03/21 1219) Antibody: Negative (03/21 1219) Rubella: 3.88 (03/21 1219) RPR: Non Reactive (07/03 0859)  HBsAg: Negative (03/21 1219)  HIV: Non Reactive (07/03 0859)  GC/Chlamydia: Negative (02/12/2018)  GBS:   Negative (02/12/2018)  2-hr GTT: 127 (12/12/2017)  Genetic screening: AFP Negative  Anatomy US: Normal   Prenatal Transfer Tool  Maternal  Diabetes: No Genetic Screening: Normal Maternal Ultrasounds/Referrals: Normal Fetal Ultrasounds or other Referrals:  None Maternal Substance Abuse:  No Significant Maternal Medications:  None Significant Maternal Lab Results: None  No results found for this or any previous visit (from the past 24 hour(s)).  Patient Active Problem List   Diagnosis Date Noted  . Indication for care in labor and delivery, antepartum 03/12/2018  . Supervision of other normal pregnancy, antepartum 08/22/2017  . Language barrier 08/22/2017    Assessment: Elizabeth Odonnell is a healthy 32 y.o. G2P1001 at [redacted]w[redacted]d here for induction of labor for post dates.   #Labor: Initiate induction of labor with cytotec, will continue to monitor with serial exams.   #Pain: Epidural  #FWB: Category 1 strip, continuous fetal monitoring  #ID: GBS negative  #MOF: Breastfeeding #MOC: Would like to have the Nexplanon  #Circ: Declines   Allayne Stack 03/12/2018, 11:05 AM   OB FELLOW HISTORY AND PHYSICAL ATTESTATION  I have seen and examined this patient; I agree with above documentation in the resident's note.   Marcy Siren, D.O. OB Fellow  03/12/2018, 6:49 PM

## 2018-03-13 LAB — RPR: RPR: NONREACTIVE

## 2018-03-13 LAB — GLUCOSE, CAPILLARY: GLUCOSE-CAPILLARY: 87 mg/dL (ref 70–99)

## 2018-03-13 MED ORDER — SENNOSIDES-DOCUSATE SODIUM 8.6-50 MG PO TABS
2.0000 | ORAL_TABLET | ORAL | Status: DC
  Administered 2018-03-13: 2 via ORAL
  Filled 2018-03-13: qty 2

## 2018-03-13 MED ORDER — ACETAMINOPHEN 325 MG PO TABS
650.0000 mg | ORAL_TABLET | ORAL | Status: DC | PRN
  Administered 2018-03-13 (×2): 650 mg via ORAL
  Filled 2018-03-13 (×2): qty 2

## 2018-03-13 MED ORDER — DIBUCAINE 1 % RE OINT: 1.0000 "application " | TOPICAL_OINTMENT | RECTAL | Status: DC | PRN

## 2018-03-13 MED ORDER — WITCH HAZEL-GLYCERIN EX PADS: 1.0000 "application " | MEDICATED_PAD | CUTANEOUS | Status: DC | PRN

## 2018-03-13 MED ORDER — SIMETHICONE 80 MG PO CHEW: 80.0000 mg | CHEWABLE_TABLET | ORAL | Status: DC | PRN

## 2018-03-13 MED ORDER — DIPHENHYDRAMINE HCL 25 MG PO CAPS: 25.0000 mg | ORAL_CAPSULE | Freq: Four times a day (QID) | ORAL | Status: DC | PRN

## 2018-03-13 MED ORDER — IBUPROFEN 600 MG PO TABS
600.0000 mg | ORAL_TABLET | Freq: Four times a day (QID) | ORAL | Status: DC
  Administered 2018-03-13 – 2018-03-14 (×6): 600 mg via ORAL
  Filled 2018-03-13 (×6): qty 1

## 2018-03-13 MED ORDER — BENZOCAINE-MENTHOL 20-0.5 % EX AERO
1.0000 "application " | INHALATION_SPRAY | CUTANEOUS | Status: DC | PRN
  Administered 2018-03-13: 1 via TOPICAL
  Filled 2018-03-13: qty 56

## 2018-03-13 MED ORDER — ONDANSETRON HCL 4 MG/2ML IJ SOLN: 4.0000 mg | INTRAMUSCULAR | Status: DC | PRN

## 2018-03-13 MED ORDER — OXYCODONE HCL 5 MG PO TABS
5.0000 mg | ORAL_TABLET | ORAL | Status: DC | PRN
  Administered 2018-03-13 – 2018-03-14 (×5): 5 mg via ORAL
  Filled 2018-03-13 (×5): qty 1

## 2018-03-13 MED ORDER — PRENATAL MULTIVITAMIN CH
1.0000 | ORAL_TABLET | Freq: Every day | ORAL | Status: DC
  Administered 2018-03-13 – 2018-03-14 (×2): 1 via ORAL
  Filled 2018-03-13 (×2): qty 1

## 2018-03-13 MED ORDER — ONDANSETRON HCL 4 MG PO TABS: 4.0000 mg | ORAL_TABLET | ORAL | Status: DC | PRN

## 2018-03-13 MED ORDER — ZOLPIDEM TARTRATE 5 MG PO TABS: 5.0000 mg | ORAL_TABLET | Freq: Every evening | ORAL | Status: DC | PRN

## 2018-03-13 MED ORDER — COCONUT OIL OIL
1.0000 "application " | TOPICAL_OIL | Status: DC | PRN
  Administered 2018-03-14: 1 via TOPICAL
  Filled 2018-03-13 (×2): qty 120

## 2018-03-13 MED ORDER — OXYCODONE HCL 5 MG PO TABS
10.0000 mg | ORAL_TABLET | ORAL | Status: DC | PRN
  Administered 2018-03-13: 10 mg via ORAL
  Filled 2018-03-13: qty 2

## 2018-03-13 MED ORDER — TETANUS-DIPHTH-ACELL PERTUSSIS 5-2.5-18.5 LF-MCG/0.5 IM SUSP
0.5000 mL | Freq: Once | INTRAMUSCULAR | Status: AC
  Administered 2018-03-13: 0.5 mL via INTRAMUSCULAR
  Filled 2018-03-13: qty 0.5

## 2018-03-13 NOTE — Lactation Note (Signed)
This note was copied from a baby's chart. Lactation Consultation Note  Patient Name: Elizabeth Odonnell [id number] Date: 03/13/2018 Reason for consult: Initial assessment;Other (Comment)(Arabic - # [id number] ) Baby is 8 hours old  Mom is experienced breast feeder x 16 months without problem.  As LC entered in the room, baby waking up, LC checked the diaper / dry.  LC offered to assist with latch and try another position/ mom receptive/  LC showed mom how to hand express/ noted a steady flow of colostrum.  And LC showed mom how to latch with the football position / right breast/  Firm support. Baby latched with depth/ multiple swallows noted/ increased with  breast compressions/ baby still feeding at 17 mins. Mom aware to let the [facility]  Maralyn Sago Riffey baby is still feeding and to document.  Per mom active with WIC / [place]. Also doesn't have a hand pump at home.  Will need a hand pump prior D/C.  Mother informed of post-discharge support and given phone number to the lactation department, including services for phone call assistance; out-patient appointments; and breastfeeding support group. List of other breastfeeding resources in the community given in the handout. Encouraged mother to call for problems or concerns related to breastfeeding.     Maternal Data Has patient been taught Hand Expression?: Yes Does the patient have breastfeeding experience prior to this delivery?: Yes  Feeding Feeding Type: Breast Fed Length of feed: (multiple swallows / still feeding at 17 mins )  LATCH Score Latch: Grasps breast easily, tongue down, lips flanged, rhythmical sucking.  Audible Swallowing: Spontaneous and intermittent  Type of Nipple: Everted at rest and after stimulation  Comfort (Breast/Nipple): Soft / non-tender  Hold (Positioning): Assistance needed to correctly position infant at breast and maintain latch.  LATCH Score: 9  Interventions Interventions: Breast feeding basics  reviewed;Assisted with latch;Skin to skin;Breast massage;Hand express;Breast compression;Adjust position;Support pillows;Position options  Lactation Tools Discussed/Used WIC Program: Yes(per mom )   Consult Status Consult Status: Follow-up Date: 03/14/18 Follow-up type: In-patient    Matilde Sprang Alaria Oconnor 03/13/2018, 11:20 AM

## 2018-03-13 NOTE — Anesthesia Postprocedure Evaluation (Signed)
Anesthesia Post Note  Patient: Elizabeth Odonnell  Procedure(s) Performed: AN AD HOC LABOR EPIDURAL     Patient location during evaluation: Mother Baby Anesthesia Type: Epidural Level of consciousness: awake and alert Pain management: pain level controlled Vital Signs Assessment: post-procedure vital signs reviewed and stable Respiratory status: spontaneous breathing, nonlabored ventilation and respiratory function stable Cardiovascular status: stable Postop Assessment: no headache, no backache and epidural receding Anesthetic complications: no    Last Vitals:  Vitals:   03/13/18 0049 03/13/18 0513  BP: 104/68 113/63  Pulse: (!) 103 (!) 107  Resp:    Temp: 36.9 C 36.8 C  SpO2:      Last Pain:  Vitals:   03/13/18 0700  TempSrc:   PainSc: 4    Pain Goal:                 [place]

## 2018-03-13 NOTE — Progress Notes (Signed)
POSTPARTUM PROGRESS NOTE  Post Partum Day 1 Subjective:  Elizabeth Odonnell is a 32 y.o. [id number] [redacted]w[redacted]d s/p SVD of a baby boy. Delivery notable for a shoulder dystocia.  Patient states she had on episode of dizziness when she got up but subsequently resolved without intervention, and she felt chills this morning which has also resolved.  Pt denies problems with ambulating, voiding or po intake.  She denies nausea or vomiting.  Pain is moderately controlled.  She has not had flatus. She has not had bowel movement.  Lochia Small.   Objective: Blood pressure 105/64, pulse 82, temperature 97.8 F (36.6 C), temperature source Oral, resp. rate 17, last menstrual period 05/29/2017, SpO2 99 %, unknown if currently breastfeeding.  Physical Exam:  General: alert, cooperative and no distress Lochia:normal flow Chest: CTAB Heart: RRR no m/r/g Abdomen: +BS, soft, diffusely tender, worse on left  Uterine Fundus: firm DVT Evaluation: No calf swelling or tenderness Extremities: no LE edema b/l  Recent Labs    03/12/18 1206  HGB 13.1  HCT 39.1    Assessment/Plan:  ASSESSMENT: Elizabeth Odonnell is a 32 y.o. [id number] [redacted]w[redacted]d s/p SVD of a baby boy.  #Breastfeeding #Circumcision: declined #Contraception: Nexplanon #Discharge on PPD #2   LOS: 1 day   Basilio Cairo, Medical Student 03/13/2018, 9:26 AM

## 2018-03-13 NOTE — Progress Notes (Signed)
Post Partum Day 1 Subjective: no complaints, up ad lib, voiding and tolerating PO  Objective: Blood pressure 113/63, pulse (!) 107, temperature 98.3 F (36.8 C), temperature source Oral, resp. rate 18, last menstrual period 05/29/2017, SpO2 99 %, unknown if currently breastfeeding.  Physical Exam:  General: alert, cooperative and no distress Lochia: appropriate Uterine Fundus: firm Incision: n/a DVT Evaluation: No evidence of DVT seen on physical exam.  Recent Labs    03/12/18 1206  HGB 13.1  HCT 39.1    Assessment/Plan: Plan for discharge tomorrow and Breastfeeding   LOS: 1 day   Wynelle Bourgeois 03/13/2018, 8:05 AM

## 2018-03-14 MED ORDER — INFLUENZA VAC SPLIT QUAD 0.5 ML IM SUSY: 0.5000 mL | PREFILLED_SYRINGE | INTRAMUSCULAR | Status: DC

## 2018-03-14 MED ORDER — IBUPROFEN 600 MG PO TABS: 600.0000 mg | ORAL_TABLET | Freq: Four times a day (QID) | ORAL | 0 refills | Status: DC | PRN

## 2018-03-14 MED ORDER — INFLUENZA VAC SPLIT QUAD 0.5 ML IM SUSY
0.5000 mL | PREFILLED_SYRINGE | INTRAMUSCULAR | Status: AC
  Administered 2018-03-14: 0.5 mL via INTRAMUSCULAR
  Filled 2018-03-14: qty 0.5

## 2018-03-14 NOTE — Discharge Summary (Addendum)
OB Discharge Summary     Patient Name: Elizabeth Odonnell DOB: [birth year] MRN: [id number]  Date of admission: 03/12/2018 Delivering MD: Aviva Signs   Date of discharge: 03/14/2018  Admitting diagnosis: INDUCTION Intrauterine pregnancy: [redacted]w[redacted]d     Secondary diagnosis:  Active Problems:   Language barrier   Indication for care in labor and delivery, antepartum   Postpartum care following vaginal delivery   Third degree laceration of perineum, type 3c  Additional problems: none     Discharge diagnosis: Term Pregnancy Delivered                                                                                                Post partum procedures:none  Augmentation: AROM, Pitocin and Cytotec  Complications: Shoulder dystocia  Hospital course:  Induction of Labor With Vaginal Delivery   32 y.o. yo [id number] at [redacted]w[redacted]d was admitted to the hospital 03/12/2018 for induction of labor.  Indication for induction: Postdates.  Patient had an uncomplicated labor course as follows: Membrane Rupture Time/Date: 8:28 PM ,03/12/2018   Intrapartum Procedures: Episiotomy: None [1]                                         Lacerations:  3rd degree [4]  Patient had delivery of a Viable infant.  Information for the patient's newborn:  Bernell, Haynie [[id number]]  Delivery Method: Vaginal, Spontaneous(Filed from Delivery Summary)   03/12/2018  Details of delivery can be found in separate delivery note.  Patient had a routine postpartum course. Patient is discharged home 03/14/18.  Physical exam  Vitals:   03/13/18 1233 03/13/18 1541 03/13/18 2249 03/14/18 0514  BP: 98/65  (!) 100/59 (!) 91/58  Pulse: 90  95 88  Resp: 16   16  Temp: 98 F (36.7 C)  97.7 F (36.5 C) 97.9 F (36.6 C)  TempSrc:   Oral Oral  SpO2: 100% 100% 100% 98%   General: alert, cooperative and no distress Lochia: appropriate Uterine Fundus: firm Incision: N/A DVT Evaluation: No evidence of DVT seen on physical  exam. No significant calf/ankle edema. Labs: Lab Results  Component Value Date   WBC 9.3 03/12/2018   HGB 13.1 03/12/2018   HCT 39.1 03/12/2018   MCV 83.4 03/12/2018   PLT 160 03/12/2018   No flowsheet data found.  Discharge instruction: per After Visit Summary and "Baby and Me Booklet".  After visit meds:    Diet: routine diet  Activity: Advance as tolerated. Pelvic rest for 6 weeks.   Outpatient follow up:4 weeks Follow up Appt:No future appointments. Follow up Visit:No follow-ups on file.  Postpartum contraception: Nexplanon  Newborn Data: Live born female  Birth Weight: 8 lb 6.9 oz (3824 g) APGAR: 9, 9  Newborn Delivery   Time head delivered:  03/12/2018 20:55:00 Birth date/time:  03/12/2018 20:56:00 Delivery type:  Vaginal, Spontaneous     Baby Feeding: Bottle and Breast Disposition:home with mother   03/14/2018 Basilio Cairo, Medical Student  I confirm that  I have verified the information documented in the students's note and that I have also personally reperformed the physical exam and all medical decision making activities.   Luna Kitchens

## 2018-03-14 NOTE — Discharge Instructions (Signed)
Vaginal Delivery, Care After °Refer to this sheet in the next few weeks. These instructions provide you with information about caring for yourself after vaginal delivery. Your health care provider may also give you more specific instructions. Your treatment has been planned according to current medical practices, but problems sometimes occur. Call your health care provider if you have any problems or questions. °What can I expect after the procedure? °After vaginal delivery, it is common to have: °· Some bleeding from your vagina. °· Soreness in your abdomen, your vagina, and the area of skin between your vaginal opening and your anus (perineum). °· Pelvic cramps. °· Fatigue. ° °Follow these instructions at home: °Medicines °· Take over-the-counter and prescription medicines only as told by your health care provider. °· If you were prescribed an antibiotic medicine, take it as told by your health care provider. Do not stop taking the antibiotic until it is finished. °Driving ° °· Do not drive or operate heavy machinery while taking prescription pain medicine. °· Do not drive for 24 hours if you received a sedative. °Lifestyle °· Do not drink alcohol. This is especially important if you are breastfeeding or taking medicine to relieve pain. °· Do not use tobacco products, including cigarettes, chewing tobacco, or e-cigarettes. If you need help quitting, ask your health care provider. °Eating and drinking °· Drink at least 8 eight-ounce glasses of water every day unless you are told not to by your health care provider. If you choose to breastfeed your baby, you may need to drink more water than this. °· Eat high-fiber foods every day. These foods may help prevent or relieve constipation. High-fiber foods include: °? Whole grain cereals and breads. °? Brown rice. °? Beans. °? Fresh fruits and vegetables. °Activity °· Return to your normal activities as told by your health care provider. Ask your health care provider  what activities are safe for you. °· Rest as much as possible. Try to rest or take a nap when your baby is sleeping. °· Do not lift anything that is heavier than your baby or 10 lb (4.5 kg) until your health care provider says that it is safe. °· Talk with your health care provider about when you can engage in sexual activity. This may depend on your: °? Risk of infection. °? Rate of healing. °? Comfort and desire to engage in sexual activity. °Vaginal Care °· If you have an episiotomy or a vaginal tear, check the area every day for signs of infection. Check for: °? More redness, swelling, or pain. °? More fluid or blood. °? Warmth. °? Pus or a bad smell. °· Do not use tampons or douches until your health care provider says this is safe. °· Watch for any blood clots that may pass from your vagina. These may look like clumps of dark red, brown, or black discharge. °General instructions °· Keep your perineum clean and dry as told by your health care provider. °· Wear loose, comfortable clothing. °· Wipe from front to back when you use the toilet. °· Ask your health care provider if you can shower or take a bath. If you had an episiotomy or a perineal tear during labor and delivery, your health care provider may tell you not to take baths for a certain length of time. °· Wear a bra that supports your breasts and fits you well. °· If possible, have someone help you with household activities and help care for your baby for at least a few days after   you leave the hospital. °· Keep all follow-up visits for you and your baby as told by your health care provider. This is important. °Contact a health care provider if: °· You have: °? Vaginal discharge that has a bad smell. °? Difficulty urinating. °? Pain when urinating. °? A sudden increase or decrease in the frequency of your bowel movements. °? More redness, swelling, or pain around your episiotomy or vaginal tear. °? More fluid or blood coming from your episiotomy or  vaginal tear. °? Pus or a bad smell coming from your episiotomy or vaginal tear. °? A fever. °? A rash. °? Little or no interest in activities you used to enjoy. °? Questions about caring for yourself or your baby. °· Your episiotomy or vaginal tear feels warm to the touch. °· Your episiotomy or vaginal tear is separating or does not appear to be healing. °· Your breasts are painful, hard, or turn red. °· You feel unusually sad or worried. °· You feel nauseous or you vomit. °· You pass large blood clots from your vagina. If you pass a blood clot from your vagina, save it to show to your health care provider. Do not flush blood clots down the toilet without having your health care provider look at them. °· You urinate more than usual. °· You are dizzy or light-headed. °· You have not breastfed at all and you have not had a menstrual period for 12 weeks after delivery. °· You have stopped breastfeeding and you have not had a menstrual period for 12 weeks after you stopped breastfeeding. °Get help right away if: °· You have: °? Pain that does not go away or does not get better with medicine. °? Chest pain. °? Difficulty breathing. °? Blurred vision or spots in your vision. °? Thoughts about hurting yourself or your baby. °· You develop pain in your abdomen or in one of your legs. °· You develop a severe headache. °· You faint. °· You bleed from your vagina so much that you fill two sanitary pads in one hour. °This information is not intended to replace advice given to you by your health care provider. Make sure you discuss any questions you have with your health care provider. °Document Released: 05/26/2000 Document Revised: 11/10/2015 Document Reviewed: 06/13/2015 °[employer] Interactive Patient Education © 2018 [employer] ° °

## 2018-03-14 NOTE — Lactation Note (Signed)
This note was copied from a baby's chart. Lactation Consultation Note  Patient Name: Elizabeth Odonnell [id number] Date: 03/14/2018 Reason for consult: Follow-up assessment;Infant weight loss;Other (Comment);Nipple pain/trauma(4% weight loss / experinenced BF . )  Baby is 52 hours old  ( Arabic interpreter Rana [phone] )  LC reviewed and updated the doc flow sheets.  As LC entered the room, mom was feeding in a cradle position and mentioned she had sore nipple.  And the baby had been cluster feeding and seemed like he was hungry and fussy so she asked for formula.  LC reminded mom of her great colostrum flow yesterday and how well the baby latched.  LC checked the baby's diaper and it was dry, baby seemed gasey, burped , was still fussy and rooting.  LC offered to assist her to in the football position/ right breast/ STS and mom receptive. Baby opened wide after LC squirted some breast milk in his mouth and assisted mom to latch with depth,  And showed mom to compress tissue so baby would latch with depth and get into a swallowing pattern  Depth achieved and per mom comfortable.  LC recommended to mom to work on the STS feedings, firm support, and hold off on the cradle position  Until the baby is latching with depth and her soreness has gone away. Mom mentioned vis the interpreter  She was much more comfortable. LC pointed out the multiple swallows noted and how much more the  Baby would be satisfied between feedings. LC also recommended if the baby feeds on the 1st breast and is still hungry to offer the 2nd breast prior to giving formula. LC also reminded mom due to being a 2nd time  Mom , experienced breast feeder over 2 years, her volume could be greater and let down quicker.  If to full , hand express, or pre-pump with hand pump, and latch with firm support.  Latch with breast compressions until swallows and then intermittent.  LC instructed mom on the use hand pump and increased Flange for  when the milk comes in to #27.  Storage of breast milk reviewed.  Mother informed of post-discharge support and given phone number to the lactation department, including services for phone call assistance; out-patient appointments; and breastfeeding support group. List of other breastfeeding resources in the community given in the handout. Encouraged mother to call for problems or concerns related to breastfeeding.   Maternal Data Has patient been taught Hand Expression?: Yes  Feeding Feeding Type: Breast Fed Nipple Type: Nfant Standard Flow (white)  LATCH Score Latch: Grasps breast easily, tongue down, lips flanged, rhythmical sucking.  Audible Swallowing: Spontaneous and intermittent  Type of Nipple: Everted at rest and after stimulation  Comfort (Breast/Nipple): Filling, red/small blisters or bruises, mild/mod discomfort  Hold (Positioning): Assistance needed to correctly position infant at breast and maintain latch.  LATCH Score: 8  Interventions Interventions: Breast feeding basics reviewed;Assisted with latch;Breast massage;Hand express;Skin to skin;Reverse pressure;Breast compression;Adjust position  Lactation Tools Discussed/Used Tools: Pump;Flanges Flange Size: 27;24;Other (comment)(#27 F for when milk comes in ) Breast pump type: Manual Pump Review: Setup, frequency, and cleaning;Milk Storage Initiated by:: MAI  Date initiated:: 03/14/18   Consult Status Consult Status: Complete Date: 03/14/18    Matilde Sprang Jaishawn Witzke 03/14/2018, 12:19 PM

## 2019-03-04 ENCOUNTER — Emergency Department (HOSPITAL_COMMUNITY)
Admission: EM | Admit: 2019-03-04 | Discharge: 2019-03-04 | Disposition: A | Discharge status: Home / Self Care | Payer: BLUE CROSS/BLUE SHIELD | Attending: Emergency Medicine | Admitting: Emergency Medicine

## 2019-03-04 ENCOUNTER — Emergency Department (HOSPITAL_COMMUNITY): Payer: BLUE CROSS/BLUE SHIELD

## 2019-03-04 ENCOUNTER — Other Ambulatory Visit: Payer: Self-pay

## 2019-03-04 ENCOUNTER — Encounter (HOSPITAL_COMMUNITY): Payer: Self-pay | Admitting: Emergency Medicine

## 2019-03-04 DIAGNOSIS — R1011 Right upper quadrant pain: Secondary | ICD-10-CM | POA: Insufficient documentation

## 2019-03-04 LAB — CBC WITH DIFFERENTIAL/PLATELET
Abs Immature Granulocytes: 0.01 10*3/uL (ref 0.00–0.07)
Basophils Absolute: 0 10*3/uL (ref 0.0–0.1)
Basophils Relative: 1 %
Eosinophils Absolute: 0.3 10*3/uL (ref 0.0–0.5)
Eosinophils Relative: 4 %
HCT: 42 % (ref 36.0–46.0)
Hemoglobin: 13.8 g/dL (ref 12.0–15.0)
Immature Granulocytes: 0 %
Lymphocytes Relative: 34 %
Lymphs Abs: 2.7 10*3/uL (ref 0.7–4.0)
MCH: 26.8 pg (ref 26.0–34.0)
MCHC: 32.9 g/dL (ref 30.0–36.0)
MCV: 81.7 fL (ref 80.0–100.0)
Monocytes Absolute: 0.4 10*3/uL (ref 0.1–1.0)
Monocytes Relative: 5 %
Neutro Abs: 4.6 10*3/uL (ref 1.7–7.7)
Neutrophils Relative %: 56 %
Platelets: 234 10*3/uL (ref 150–400)
RBC: 5.14 MIL/uL — ABNORMAL HIGH (ref 3.87–5.11)
RDW: 13.1 % (ref 11.5–15.5)
WBC: 8 10*3/uL (ref 4.0–10.5)
nRBC: 0 % (ref 0.0–0.2)

## 2019-03-04 LAB — COMPREHENSIVE METABOLIC PANEL
ALT: 12 U/L (ref 0–44)
AST: 22 U/L (ref 15–41)
Albumin: 3.8 g/dL (ref 3.5–5.0)
Alkaline Phosphatase: 99 U/L (ref 38–126)
Anion gap: 9 (ref 5–15)
BUN: 8 mg/dL (ref 6–20)
CO2: 25 mmol/L (ref 22–32)
Calcium: 9.7 mg/dL (ref 8.9–10.3)
Chloride: 102 mmol/L (ref 98–111)
Creatinine, Ser: 0.49 mg/dL (ref 0.44–1.00)
GFR calc Af Amer: >60 mL/min (ref >60)
GFR calc non Af Amer: >60 mL/min (ref >60)
Glucose, Bld: 94 mg/dL (ref 70–99)
Potassium: 3.9 mmol/L (ref 3.5–5.1)
Sodium: 136 mmol/L (ref 135–145)
Total Bilirubin: 0.3 mg/dL (ref 0.3–1.2)
Total Protein: 7.9 g/dL (ref 6.5–8.1)

## 2019-03-04 LAB — PREGNANCY, URINE: Preg Test, Ur: NEGATIVE

## 2019-03-04 LAB — URINALYSIS, ROUTINE W REFLEX MICROSCOPIC
Bilirubin Urine: NEGATIVE
Glucose, UA: NEGATIVE mg/dL
Hgb urine dipstick: NEGATIVE
Ketones, ur: NEGATIVE mg/dL
Leukocytes,Ua: NEGATIVE
Nitrite: NEGATIVE
Protein, ur: NEGATIVE mg/dL
Specific Gravity, Urine: 1.026 (ref 1.005–1.030)
pH: 5 (ref 5.0–8.0)

## 2019-03-04 LAB — LIPASE, BLOOD: Lipase: 25 U/L (ref 11–51)

## 2019-03-04 MED ORDER — ONDANSETRON HCL 4 MG/2ML IJ SOLN: 4.0000 mg | Freq: Once | INTRAMUSCULAR | Status: DC

## 2019-03-04 MED ORDER — LIDOCAINE VISCOUS HCL 2 % MT SOLN
15.0000 mL | Freq: Once | OROMUCOSAL | Status: AC
  Administered 2019-03-04: 15 mL via ORAL
  Filled 2019-03-04: qty 15

## 2019-03-04 MED ORDER — ONDANSETRON 4 MG PO TBDP
4.0000 mg | ORAL_TABLET | Freq: Once | ORAL | Status: AC
  Administered 2019-03-04: 4 mg via ORAL
  Filled 2019-03-04: qty 1

## 2019-03-04 MED ORDER — ONDANSETRON HCL 4 MG PO TABS: 4.0000 mg | ORAL_TABLET | Freq: Four times a day (QID) | ORAL | 0 refills | Status: DC

## 2019-03-04 MED ORDER — PANTOPRAZOLE SODIUM 20 MG PO TBEC: 20.0000 mg | DELAYED_RELEASE_TABLET | Freq: Every day | ORAL | 0 refills | Status: DC

## 2019-03-04 MED ORDER — ALUM & MAG HYDROXIDE-SIMETH 200-200-20 MG/5ML PO SUSP
30.0000 mL | Freq: Once | ORAL | Status: AC
  Administered 2019-03-04: 21:00:00 30 mL via ORAL
  Filled 2019-03-04: qty 30

## 2019-03-04 NOTE — ED Notes (Signed)
Patient transported to US 

## 2019-03-04 NOTE — ED Provider Notes (Signed)
[facility] EMERGENCY DEPARTMENT Provider Note   CSN: [id number] Arrival date & time: 03/04/19  1538     History   Chief Complaint No chief complaint on file.   HPI Elizabeth Odonnell is a 33 y.o. female.     Patient is a 33 year old female with no past medical history presenting to the emergency department for right upper quadrant abdominal pain.  Patient reports that this is been going on for about a month.  It is worse with eating food.  Especially eating [place] fries and eggs.  She reports nausea and vomiting with eating.  Denies any fever, diarrhea, chills.  Reports that her primary care doctor scheduled her for an ultrasound but that it was too painful and she needed to come in before then.     No past medical history on file.  There are no active problems to display for this patient.      OB History   No obstetric history on file.      Home Medications    Prior to Admission medications   Medication Sig Start Date End Date Taking? Authorizing Provider  ondansetron (ZOFRAN) 4 MG tablet Take 1 tablet (4 mg total) by mouth every 6 (six) hours. 03/04/19   Arlyn Dunning, PA-C  pantoprazole (PROTONIX) 20 MG tablet Take 1 tablet (20 mg total) by mouth daily. 03/04/19 04/03/19  Arlyn Dunning, PA-C    Family History No family history on file.  Social History Social History   Tobacco Use   Smoking status: Never Smoker   Smokeless tobacco: Never Used  Substance Use Topics   Alcohol use: Not on file   Drug use: Not on file     Allergies   Patient has no allergy information on record.   Review of Systems Review of Systems  Constitutional: Negative for appetite change, chills and fever.  HENT: Negative for ear pain and sore throat.   Eyes: Negative for pain and visual disturbance.  Respiratory: Negative for cough and shortness of breath.   Cardiovascular: Negative for chest pain and palpitations.  Gastrointestinal: Positive  for abdominal pain, nausea and vomiting. Negative for constipation and diarrhea.  Genitourinary: Negative for difficulty urinating, dysuria and hematuria.  Musculoskeletal: Negative for arthralgias, back pain and myalgias.  Skin: Negative for color change and rash.  Allergic/Immunologic: Negative for immunocompromised state.  Neurological: Negative for dizziness, seizures, syncope, light-headedness and headaches.  All other systems reviewed and are negative.    Physical Exam Updated Vital Signs BP 107/71    Pulse 81    Temp 98 F (36.7 C) (Oral)    Resp 10    SpO2 100%   Physical Exam Vitals signs and nursing note reviewed.  Constitutional:      General: She is not in acute distress.    Appearance: She is well-developed.  HENT:     Head: Normocephalic and atraumatic.  Eyes:     Conjunctiva/sclera: Conjunctivae normal.  Neck:     Musculoskeletal: Neck supple.  Cardiovascular:     Rate and Rhythm: Normal rate and regular rhythm.     Heart sounds: No murmur.  Pulmonary:     Effort: Pulmonary effort is normal. No respiratory distress.     Breath sounds: Normal breath sounds.  Abdominal:     Palpations: Abdomen is soft.     Tenderness: There is abdominal tenderness in the right upper quadrant and epigastric area. There is no right CVA tenderness or left CVA tenderness.  Positive signs include Murphy's sign.  Skin:    General: Skin is warm and dry.  Neurological:     Mental Status: She is alert.      ED Treatments / Results  Labs (all labs ordered are listed, but only abnormal results are displayed) Labs Reviewed  CBC WITH DIFFERENTIAL/PLATELET - Abnormal; Notable for the following components:      Result Value   RBC 5.14 (*)    All other components within normal limits  URINALYSIS, ROUTINE W REFLEX MICROSCOPIC - Abnormal; Notable for the following components:   APPearance HAZY (*)    All other components within normal limits  COMPREHENSIVE METABOLIC PANEL  LIPASE,  BLOOD  PREGNANCY, URINE  I-STAT BETA HCG BLOOD, ED ([facility], [facility], [facility] ONLY)    EKG None  Radiology US Abdomen Limited Ruq  Result Date: 03/04/2019 CLINICAL DATA:  Right upper quadrant pain. EXAM: ULTRASOUND ABDOMEN LIMITED RIGHT UPPER QUADRANT COMPARISON:  None. FINDINGS: Gallbladder: Physiologically distended no gallstones or wall thickening visualized. No sonographic Murphy sign noted by sonographer. Common bile duct: Diameter: 3 mm. Liver: No focal lesion identified. Within normal limits in parenchymal echogenicity. Portal vein is patent on color Doppler imaging with normal direction of blood flow towards the liver. Other: None. IMPRESSION: Normal right upper quadrant ultrasound. Electronically Signed   By: Keith Rake M.D.   On: 03/04/2019 20:28    Procedures Procedures (including critical care time)  Medications Ordered in ED Medications  ondansetron (ZOFRAN) injection 4 mg (has no administration in time range)  alum & mag hydroxide-simeth (MAALOX/MYLANTA) 200-200-20 MG/5ML suspension 30 mL (has no administration in time range)    And  lidocaine (XYLOCAINE) 2 % viscous mouth solution 15 mL (has no administration in time range)     Initial Impression / Assessment and Plan / ED Course  I have reviewed the triage vital signs and the nursing notes.  Pertinent labs & imaging results that were available during my care of the patient were reviewed by me and considered in my medical decision making (see chart for details).  Clinical Course as of Mar 04 2051  Tue Sep 22, 492  663 33 year old female with no past medical history presenting for right upper quadrant pain after eating over the last month.  Seeing her primary care doctor for the same.  Labs were normal here today and right upper quadrant ultrasound was also normal.  Patient was given GI cocktail and Zofran.  No episodes of vomiting or significant symptoms here in the emergency department.  We will send her home with Protonix  and Zofran and have her follow-up with primary care doctor for possible GI referral if symptoms continue.  Discussed with her keeping a food journal also to help her to avoid foods that are exacerbating her symptoms.   [KM]    Clinical Course User Index [KM] Alveria Apley, PA-C       Based on review of vitals, medical screening exam, lab work and/or imaging, there does not appear to be an acute, emergent etiology for the patient's symptoms. Counseled pt on good return precautions and encouraged both PCP and ED follow-up as needed.  Prior to discharge, I also discussed incidental imaging findings with patient in detail and advised appropriate, recommended follow-up in detail.  Clinical Impression: 1. RUQ pain     Disposition: Discharge  Prior to providing a prescription for a controlled substance, I independently reviewed the patient's recent prescription history on the [place].  The patient had no recent or regular prescriptions and was deemed appropriate for a brief, less than 3 day prescription of narcotic for acute analgesia.  This note was prepared with assistance of Conservation officer, historic buildings. Occasional wrong-word or sound-a-like substitutions may have occurred due to the inherent limitations of voice recognition software.   Final Clinical Impressions(s) / ED Diagnoses   Final diagnoses:  RUQ pain    ED Discharge Orders         Ordered    pantoprazole (PROTONIX) 20 MG tablet  Daily     03/04/19 2051    ondansetron (ZOFRAN) 4 MG tablet  Every 6 hours     03/04/19 2051           Jeral Pinch 03/04/19 2053    Melene Plan, DO 03/04/19 [phone]

## 2019-03-04 NOTE — Discharge Instructions (Addendum)
Thank you for allowing me to care for you today. Please return to the emergency department if you have new or worsening symptoms. Take your medications as instructed.  ° °

## 2019-03-04 NOTE — ED Notes (Signed)
ED Provider at bedside with translator pad

## 2019-03-04 NOTE — ED Notes (Signed)
Patient verbalizes understanding of discharge instructions. Opportunity for questioning and answers were provided. Armband removed by staff, pt discharged from ED ambulatory to Home.   

## 2019-03-04 NOTE — ED Triage Notes (Signed)
Pt here for ruq pain along with epigastric pain , pt has had some nausea and vomiting also

## 2019-03-05 ENCOUNTER — Encounter (HOSPITAL_COMMUNITY): Payer: Self-pay

## 2020-05-24 ENCOUNTER — Other Ambulatory Visit: Payer: Self-pay

## 2020-05-24 ENCOUNTER — Ambulatory Visit (INDEPENDENT_AMBULATORY_CARE_PROVIDER_SITE_OTHER): Payer: Medicaid Other

## 2020-05-24 DIAGNOSIS — Z3200 Encounter for pregnancy test, result unknown: Secondary | ICD-10-CM

## 2020-05-24 DIAGNOSIS — Z3201 Encounter for pregnancy test, result positive: Secondary | ICD-10-CM

## 2020-05-24 LAB — POCT PREGNANCY, URINE: Preg Test, Ur: POSITIVE — AB

## 2020-05-24 NOTE — Progress Notes (Signed)
Called patient at [phone] with [facility], [facility] interpreter ID [phone] [place]; VM left stating I am calling with results and to return phone call for results. Callback number given.  Called pt 5 minutes later at [phone] with [facility] interpreter [place] ID [phone]. Verified correct pt; positive UPT result given. Pt denies vaginal bleeding. Endorses mild, intermittent pain in lower part of abdomen beginning 9 days ago. Miscarriage vs Ectopic precautions reviewed. Pt is 9w 1d today based on LMP of 03/21/20 with EDD of 12/26/20. Pt would like to receive prenatal care in our office. Front office notified to schedule pt for new OB appts.   Fleet Contras RN  05/24/20

## 2020-06-12 NOTE — L&D Delivery Note (Signed)
Elizabeth Odonnell Elizabeth Odonnell is a 34 y.o. female [phone] with IUP at [redacted]w[redacted]d admitted for early labor.  She progressed with pitocin and AROM augmentation to complete and pushed 2 times to deliver.  Cord clamping delayed by several minutes then clamped by CNM and cut by CNM.     Delivery Note At 3:04 PM a viable female was delivered via Vaginal, Spontaneous (Presentation: Right Occiput Anterior).  APGAR: 9, 9; weight  pending.   Placenta status: Spontaneous, Intact.  Cord: 3 vessels with the following complications: Nuchal x1, delivered through and easily reduced.   Anesthesia: Epidural Episiotomy: None Lacerations: 2nd degree Suture Repair: 3.0 vicryl Est. Blood Loss (mL):  300  Mom to postpartum.  Baby to Couplet care / Skin to Skin.  Rolm Bookbinder CNM 12/21/2020, 3:22 PM

## 2020-06-24 ENCOUNTER — Other Ambulatory Visit: Payer: Self-pay

## 2020-06-24 ENCOUNTER — Telehealth: Payer: Medicaid Other

## 2020-07-05 ENCOUNTER — Encounter: Payer: Self-pay | Admitting: Obstetrics and Gynecology

## 2020-07-05 ENCOUNTER — Ambulatory Visit (INDEPENDENT_AMBULATORY_CARE_PROVIDER_SITE_OTHER): Payer: Medicaid Other | Admitting: Obstetrics and Gynecology

## 2020-07-05 ENCOUNTER — Other Ambulatory Visit: Payer: Self-pay

## 2020-07-05 ENCOUNTER — Other Ambulatory Visit (HOSPITAL_COMMUNITY)
Admission: RE | Admit: 2020-07-05 | Discharge: 2020-07-05 | Disposition: A | Discharge status: Home / Self Care | Payer: Medicaid Other | Source: Ambulatory Visit | Attending: Obstetrics and Gynecology | Admitting: Obstetrics and Gynecology

## 2020-07-05 VITALS — BP 109/67 | HR 103 | Ht 60.0 in | Wt 132.8 lb

## 2020-07-05 DIAGNOSIS — Z3490 Encounter for supervision of normal pregnancy, unspecified, unspecified trimester: Secondary | ICD-10-CM | POA: Diagnosis not present

## 2020-07-05 DIAGNOSIS — Z23 Encounter for immunization: Secondary | ICD-10-CM

## 2020-07-05 DIAGNOSIS — Z789 Other specified health status: Secondary | ICD-10-CM | POA: Diagnosis present

## 2020-07-05 DIAGNOSIS — Z3A15 15 weeks gestation of pregnancy: Secondary | ICD-10-CM

## 2020-07-05 NOTE — Progress Notes (Signed)
  Subjective:    Elizabeth Odonnell is a [id number] [redacted]w[redacted]d being seen today for her first obstetrical visit.  Her obstetrical history is significant for language barrier. Patient does intend to breast feed. Pregnancy history fully reviewed.  Hx 2 prior normal vaginal deliveries. Pelvis tested to 8lbs 7 oz. Last delivery notable for 3rd degree laceration.   Patient reports no complaints.  Vitals:   07/05/20 1412 07/05/20 1417  BP: 109/67   Pulse: (!) 103   Weight: 132 lb 12.8 oz (60.2 kg)   Height:  5' (1.524 m)    HISTORY: OB History  Gravida Para Term Preterm AB Living  3 2 2  0 0 2  SAB IAB Ectopic Multiple Live Births  0 0 0   2    # Outcome Date GA Lbr Len/2nd Weight Sex Delivery Anes PTL Lv  3 Current           2 Term 03/12/18 [redacted]w[redacted]d 03:42 / 00:27 8 lb 6.9 oz (3.824 kg) M Vag-Spont EPI  LIV     Birth Comments: IOL Postdates  1 Term 02/23/15 [redacted]w[redacted]d  6 lb 9.8 oz (3 kg) M Vag-Spont Local N LIV     Birth Comments: postdates   Past Medical History:  Diagnosis Date  . Medical history non-contributory   . Pelvic pain    History reviewed. No pertinent surgical history. Family History  Problem Relation Age of Onset  . Diabetes Mother      Exam    Uterus:     Pelvic Exam:    Perineum: No Hemorrhoids   Vulva: normal   Vagina:  normal mucosa       Cervix: no lesions   Adnexa: normal adnexa      System:     Skin: normal coloration and turgor, no rashes    Neurologic: normal   Extremities: normal strength, tone, and muscle mass   HEENT PERRLA   Cardiovascular: regular rate and rhythm   Respiratory:  appears well, vitals normal, no respiratory distress, acyanotic, normal RR, ear and throat exam is normal, neck free of mass or lymphadenopathy, chest clear, no wheezing, crepitations, rhonchi, normal symmetric air entry   Abdomen: soft, non-tender; bowel sounds normal; no masses,  no organomegaly   Urinary: urethral meatus normal      Assessment:    Pregnancy:  [redacted]w[redacted]d Patient Active Problem List   Diagnosis Date Noted  . Supervision of low-risk pregnancy 07/05/2020  . Language barrier 08/22/2017        Plan:  Supervision of low risk pregnancy, [redacted] weeks gestation Initial labs drawn. Prenatal vitamins. Problem list reviewed and updated. Genetic Screening discussed First Screen: requested.  Ultrasound discussed; fetal survey: requested.  Follow up in 4 weeks.    08/24/2017 07/05/2020

## 2020-07-05 NOTE — Progress Notes (Signed)
Here for intial prenatal visit. Given bp cuff with instructions- does not have cuff. Is applying for pregn medicaid. Would like [facility] Genetic testing.  Robertson Colclough,RN

## 2020-07-06 LAB — CYTOLOGY - PAP
Adequacy: ABSENT
Comment: NEGATIVE
Diagnosis: NEGATIVE
High risk HPV: NEGATIVE

## 2020-07-06 LAB — CBC/D/PLT+RPR+RH+ABO+RUB AB...
Antibody Screen: NEGATIVE
Basophils Absolute: 0 10*3/uL (ref 0.0–0.2)
Basos: 0 %
EOS (ABSOLUTE): 0.1 10*3/uL (ref 0.0–0.4)
Eos: 1 %
HCV Ab: 0.2 s/co ratio (ref 0.0–0.9)
HIV Screen 4th Generation wRfx: NONREACTIVE
Hematocrit: 30.1 % — ABNORMAL LOW (ref 34.0–46.6)
Hemoglobin: 9.6 g/dL — ABNORMAL LOW (ref 11.1–15.9)
Hepatitis B Surface Ag: NEGATIVE
Immature Grans (Abs): 0 10*3/uL (ref 0.0–0.1)
Immature Granulocytes: 1 %
Lymphocytes Absolute: 2.1 10*3/uL (ref 0.7–3.1)
Lymphs: 28 %
MCH: 21.5 pg — ABNORMAL LOW (ref 26.6–33.0)
MCHC: 31.9 g/dL (ref 31.5–35.7)
MCV: 67 fL — ABNORMAL LOW (ref 79–97)
Monocytes Absolute: 0.4 10*3/uL (ref 0.1–0.9)
Monocytes: 6 %
Neutrophils Absolute: 4.9 10*3/uL (ref 1.4–7.0)
Neutrophils: 64 %
Platelets: 292 10*3/uL (ref 150–450)
RBC: 4.47 x10E6/uL (ref 3.77–5.28)
RDW: 17.6 % — ABNORMAL HIGH (ref 11.7–15.4)
RPR Ser Ql: NONREACTIVE
Rh Factor: POSITIVE
Rubella Antibodies, IGG: 10.1 index (ref >0.99)
WBC: 7.5 10*3/uL (ref 3.4–10.8)

## 2020-07-06 LAB — GC/CHLAMYDIA PROBE AMP (~~LOC~~) NOT AT ARMC
Chlamydia: NEGATIVE
Comment: NEGATIVE
Comment: NORMAL
Neisseria Gonorrhea: NEGATIVE

## 2020-07-06 LAB — HCV INTERPRETATION

## 2020-07-06 LAB — HEMOGLOBIN A1C
Est. average glucose Bld gHb Est-mCnc: 108 mg/dL
Hgb A1c MFr Bld: 5.4 % (ref 4.8–5.6)

## 2020-07-07 ENCOUNTER — Other Ambulatory Visit: Payer: Self-pay | Admitting: Obstetrics and Gynecology

## 2020-07-07 MED ORDER — FERROUS SULFATE 325 (65 FE) MG PO TABS: 325.0000 mg | ORAL_TABLET | ORAL | 11 refills | Status: DC

## 2020-07-08 ENCOUNTER — Telehealth: Payer: Self-pay

## 2020-07-08 LAB — CULTURE, OB URINE

## 2020-07-08 LAB — URINE CULTURE, OB REFLEX

## 2020-07-08 NOTE — Telephone Encounter (Signed)
Called Pt using [employer] Interpreter [place] id# [phone] because she was concerned of fetal movement, Pt is only 15 w 4d, no answer, left VM stating that movement really does not start until 18 to 20 weeks. Any further questions to call us at [phone].

## 2020-08-02 ENCOUNTER — Ambulatory Visit: Payer: Medicaid Other

## 2020-08-03 ENCOUNTER — Telehealth (INDEPENDENT_AMBULATORY_CARE_PROVIDER_SITE_OTHER): Payer: Medicaid Other | Admitting: Obstetrics and Gynecology

## 2020-08-03 ENCOUNTER — Encounter: Payer: Self-pay | Admitting: Obstetrics and Gynecology

## 2020-08-03 VITALS — BP 106/63 | HR 104

## 2020-08-03 DIAGNOSIS — O26892 Other specified pregnancy related conditions, second trimester: Secondary | ICD-10-CM

## 2020-08-03 DIAGNOSIS — R1012 Left upper quadrant pain: Secondary | ICD-10-CM | POA: Diagnosis not present

## 2020-08-03 DIAGNOSIS — R1032 Left lower quadrant pain: Secondary | ICD-10-CM

## 2020-08-03 DIAGNOSIS — Z3A19 19 weeks gestation of pregnancy: Secondary | ICD-10-CM | POA: Diagnosis not present

## 2020-08-03 DIAGNOSIS — Z3492 Encounter for supervision of normal pregnancy, unspecified, second trimester: Secondary | ICD-10-CM

## 2020-08-03 DIAGNOSIS — N949 Unspecified condition associated with female genital organs and menstrual cycle: Secondary | ICD-10-CM

## 2020-08-03 MED ORDER — PRENATAL PLUS 27-1 MG PO TABS: 1.0000 | ORAL_TABLET | Freq: Every day | ORAL | 11 refills | Status: DC

## 2020-08-03 MED ORDER — COMFORT FIT MATERNITY SUPP SM MISC: 1.0000 [IU] | Freq: Every day | 0 refills | Status: DC | PRN

## 2020-08-03 NOTE — Progress Notes (Signed)
Pt states is having some belly pain, problems sleeping at night.

## 2020-08-03 NOTE — Patient Instructions (Addendum)
PREGNANCY SUPPORT BELT: You are not alone, Seventy-five percent of women have some sort of abdominal or back pain at some point in their pregnancy. Your baby is growing at a fast pace, which means that your whole body is rapidly trying to adjust to the changes. As your uterus grows, your back may start feeling a bit under stress and this can result in back or abdominal pain that can go from mild, and therefore bearable, to severe pains that will not allow you to sit or lay down comfortably, When it comes to dealing with pregnancy-related pains and cramps, some pregnant women usually prefer natural remedies, which the market is filled with nowadays. For example, wearing a pregnancy support belt can help ease and lessen your discomfort and pain.  WHAT ARE THE BENEFITS OF WEARING A PREGNANCY SUPPORT BELT? A pregnancy support belt provides support to the lower portion of the belly taking some of the weight of the growing uterus and distributing to the other parts of your body. It is designed make you comfortable and gives you extra support. Over the years, the pregnancy apparel market has been studying the needs and wants of pregnant women and they have come up with the most comfortable pregnancy support belts that woman could ever ask for. In fact, you will no longer have to wear a stretched-out or bulky pregnancy belt that is visible underneath your clothes and makes you feel even more uncomfortable. Nowadays, a pregnancy support belt is made of comfortable and stretchy materials that will not irritate your skin but will actually make you feel at ease and you will not even notice you are wearing it. They are easy to put on and adjust during the day and can be worn at night for additional support.  BENEFITS: . Relives Back pain . Relieves Abdominal Muscle and Leg Pain . Stabilizes the Pelvic Ring . Offers a Cushioned Abdominal Lift Pad . Relieves pressure on the Sciatic Nerve Within Minutes WHERE TO GET  YOUR PREGNANCY BELT:   [facility] [address] [place], [place] [address] [phone]  [facility] [address], [address] [place], [place] [address] [phone]   Round Ligament Pain  The round ligament is a cord of muscle and tissue that helps support the uterus. It can become a source of pain during pregnancy if it becomes stretched or twisted as the baby grows. The pain usually begins in the second trimester (13-28 weeks) of pregnancy, and it can come and go until the baby is delivered. It is not a serious problem, and it does not cause harm to the baby. Round ligament pain is usually a short, sharp, and pinching pain, but it can also be a dull, lingering, and aching pain. The pain is felt in the lower side of the abdomen or in the groin. It usually starts deep in the groin and moves up to the outside of the hip area. The pain may occur when you:  Suddenly change position, such as quickly going from a sitting to standing position.  Roll over in bed.  Cough or sneeze.  Do physical activity. Follow these instructions at home:  Watch your condition for any changes.  When the pain starts, relax. Then try any of these methods to help with the pain: ? Sitting down. ? Flexing your knees up to your abdomen. ? Lying on your side with one pillow under your abdomen and another pillow between your legs. ? Sitting in a warm  bath for 15-20 minutes or until the pain goes away.  Take over-the-counter and prescription medicines only as told by your health care provider.  Move slowly when you sit down or stand up.  Avoid long walks if they cause pain.  Stop or reduce your physical activities if they cause pain.  Keep all follow-up visits as told by your health care provider. This is important.   Contact a health care provider if:  Your pain does not go away with treatment.  You feel pain in your back that you did not have before.  Your  medicine is not helping. Get help right away if:  You have a fever or chills.  You develop uterine contractions.  You have vaginal bleeding.  You have nausea or vomiting.  You have diarrhea.  You have pain when you urinate. Summary  Round ligament pain is felt in the lower abdomen or groin. It is usually a short, sharp, and pinching pain. It can also be a dull, lingering, and aching pain.  This pain usually begins in the second trimester (13-28 weeks). It occurs because the uterus is stretching with the growing baby, and it is not harmful to the baby.  You may notice the pain when you suddenly change position, when you cough or sneeze, or during physical activity.  Relaxing, flexing your knees to your abdomen, lying on one side, or taking a warm bath may help to get rid of the pain.  Get help from your health care provider if the pain does not go away or if you have vaginal bleeding, nausea, vomiting, diarrhea, or painful urination. This information is not intended to replace advice given to you by your health care provider. Make sure you discuss any questions you have with your health care provider. Document Revised: 11/14/2017 Document Reviewed: 11/14/2017 Elsevier Patient Education  2021 [employer].

## 2020-08-03 NOTE — Progress Notes (Signed)
   MY CHART VIDEO VIRTUAL OBSTETRICS VISIT ENCOUNTER NOTE  I connected with Elizabeth Odonnell on 08/03/20 at  1:55 PM EST by My Chart video at home and verified that I am speaking with the correct person using two identifiers. Provider located at [employer] for [employer] at [employer] [facility].   I discussed the limitations, risks, security and privacy concerns of performing an evaluation and management service by My Chart video and the availability of in person appointments. I also discussed with the patient that there may be a patient responsible charge related to this service. The patient expressed understanding and agreed to proceed.  Subjective:  Elizabeth Odonnell is a 35 y.o. G3P2002 at [redacted]w[redacted]d being followed for ongoing prenatal care.  She is currently monitored for the following issues for this low-risk pregnancy and has Language barrier and Supervision of low-risk pregnancy on their problem list.  Patient reports LT side abdominal pain when sleeping on the LT side and sharp LUQ pain that sometimes causes her to vomit. She also reports having lower LT sided abdominal pain when she is trying to sleep. So she will get up and watch tv at night since she can't sleep. Reports no fetal movement. Denies any contractions, bleeding or leaking of fluid.   The following portions of the patient's history were reviewed and updated as appropriate: allergies, current medications, past family history, past medical history, past social history, past surgical history and problem list.   Objective:   General:  Alert, oriented and cooperative.   Mental Status: Normal mood and affect perceived. Normal judgment and thought content.  Rest of physical exam deferred due to type of encounter  BP 106/63   Pulse (!) 104   LMP 03/21/2020 (Exact Date)  **Done by patient's own at home BP cuff and scale  Assessment and Plan:  Pregnancy: G3P2002 at [redacted]w[redacted]d  1. Encounter for supervision of  low-risk pregnancy in second trimester - Rx for prenatal vitamin w/FE, FA (PRENATAL 1 + 1) 27-1 MG TABS tablet; Take 1 tablet by mouth daily at 12 noon.  Dispense: 30 tablet; Refill: 11  2. Round ligament pain  - Rx for Elastic Bandages & Supports (COMFORT FIT MATERNITY SUPP SM) MISC - Information provided on RLP and instructions on where to get maternity support belt - Patient to pick up Rx at front desk  3. [redacted] weeks gestation of pregnancy    Preterm labor symptoms and general obstetric precautions including but not limited to vaginal bleeding, contractions, leaking of fluid and fetal movement were reviewed in detail with the patient.  I discussed the assessment and treatment plan with the patient. The patient was provided an opportunity to ask questions and all were answered. The patient agreed with the plan and demonstrated an understanding of the instructions. The patient was advised to call back or seek an in-person office evaluation/go to MAU at [facility] for any urgent or concerning symptoms. Please refer to After Visit Summary for other counseling recommendations.   I provided 10 minutes of non-face-to-face time during this encounter. There was 5 minutes of chart review time spent prior to this encounter. Total time spent = 15 minutes.  Return in about 4 weeks (around 08/31/2020) for Return OB - My Chart video.  Future Appointments  Date Time Provider Department Center  08/09/2020  2:00 PM [facility] US1 [facility]    Raelyn Mora, CNM Center for [employer], [facility]

## 2020-08-06 ENCOUNTER — Encounter: Payer: Self-pay | Admitting: *Deleted

## 2020-08-09 ENCOUNTER — Other Ambulatory Visit: Payer: Self-pay

## 2020-08-09 ENCOUNTER — Ambulatory Visit: Payer: Medicaid Other | Attending: Obstetrics and Gynecology

## 2020-08-09 DIAGNOSIS — Z3490 Encounter for supervision of normal pregnancy, unspecified, unspecified trimester: Secondary | ICD-10-CM

## 2020-08-30 ENCOUNTER — Encounter: Payer: Self-pay | Admitting: *Deleted

## 2020-08-31 ENCOUNTER — Encounter: Payer: Self-pay | Admitting: Nurse Practitioner

## 2020-08-31 ENCOUNTER — Telehealth (INDEPENDENT_AMBULATORY_CARE_PROVIDER_SITE_OTHER): Payer: Medicaid Other | Admitting: Nurse Practitioner

## 2020-08-31 VITALS — BP 112/69 | HR 103

## 2020-08-31 DIAGNOSIS — Z3A23 23 weeks gestation of pregnancy: Secondary | ICD-10-CM

## 2020-08-31 DIAGNOSIS — R12 Heartburn: Secondary | ICD-10-CM

## 2020-08-31 DIAGNOSIS — O26892 Other specified pregnancy related conditions, second trimester: Secondary | ICD-10-CM

## 2020-08-31 DIAGNOSIS — Z3492 Encounter for supervision of normal pregnancy, unspecified, second trimester: Secondary | ICD-10-CM

## 2020-08-31 DIAGNOSIS — Z789 Other specified health status: Secondary | ICD-10-CM

## 2020-08-31 DIAGNOSIS — O26899 Other specified pregnancy related conditions, unspecified trimester: Secondary | ICD-10-CM

## 2020-08-31 NOTE — Progress Notes (Signed)
I connected with  Sandre Kitty on 08/31/20 at 10:15 AM EDT by MyChart and verified that I am speaking with the correct person using two identifiers.   I discussed the limitations, risks, security and privacy concerns of performing an evaluation and management service by telephone and the availability of in person appointments. I also discussed with the patient that there may be a patient responsible charge related to this service. The patient expressed understanding and agreed to proceed.  Marjo Bicker, RN 08/31/2020  10:17 AM

## 2020-08-31 NOTE — Progress Notes (Signed)
OBSTETRICS PRENATAL VIRTUAL VISIT ENCOUNTER NOTE  [facility]   Patient location: Home  I connected with Elizabeth Odonnell on 08/31/20 at 10:15 AM EDT by MyChart Video Encounter and verified that I am speaking with the correct person using two identifiers.   I discussed the limitations, risks, security and privacy concerns of performing an evaluation and management service virtually and the availability of in person appointments. I also discussed with the patient that there may be a patient responsible charge related to this service. The patient expressed understanding and agreed to proceed.  Interpreter present on the call and present for the entire visit.  Subjective:  Elizabeth Odonnell Elizabeth Odonnell is a 35 y.o. G3P2002 at 953w2d being seen today for ongoing prenatal care.  She is currently monitored for the following issues for this low-risk pregnancy and has Language barrier and Supervision of low-risk pregnancy on their problem list.  Patient reports some occasional left side pain .  Contractions: Irritability. Vag. Bleeding: None.  Movement: Present. Denies any leaking of fluid.   The following portions of the patient's history were reviewed and updated as appropriate: allergies, current medications, past family history, past medical history, past social history, past surgical history and problem list.   Objective:   Vitals:   08/31/20 1019  BP: 112/69  Pulse: (!) 103    Fetal Status:     Movement: Present     General:  Alert, oriented and cooperative. Patient is in no acute distress.  Respiratory: Normal respiratory effort, no problems with respiration noted  Mental Status: Normal mood and affect. Normal behavior. Normal judgment and thought content.  Rest of physical exam deferred due to type of encounter  Imaging: US MFM OB COMP + 14 WK  Result Date:  08/09/2020 ----------------------------------------------------------------------  OBSTETRICS REPORT                       (Signed Final 08/09/2020 03:47 pm) ---------------------------------------------------------------------- Patient Info  ID #:       [id number]                          D.O.B.:  09/09/1985 (34 yrs)  Name:       Elizabeth Odonnell                    Visit Date: 08/09/2020 01:44 pm              [place] ---------------------------------------------------------------------- Performed By  Attending:        Ma RingsVictor Fang MD         Referred By:      Gita KudoJULIA M MARSALA  Performed By:     Ceasar LundKristin Jackson        [facility] ---------------------------------------------------------------------- Orders  #  Description                           Code        Ordered By  1  [place] MFM OB COMP + 14 WK                [id number]    JULIA MARSALA ----------------------------------------------------------------------  #  Order #                     Accession #                Episode #  1  [id number]                   [id number]                 [id number] ---------------------------------------------------------------------- Indications  Antenatal screening for malformations          Z36.3  Normal NIPS  [redacted] weeks gestation of pregnancy                Z3A.20 ---------------------------------------------------------------------- Fetal Evaluation  Num Of Fetuses:         1  Fetal Heart Rate(bpm):  137  Cardiac Activity:       Observed  Presentation:           Breech  Placenta:               Anterior  P. Cord Insertion:      Visualized, central  Amniotic Fluid  AFI FV:      Within normal limits ---------------------------------------------------------------------- Biometry  BPD:      47.4  mm     G. Age:   20w 2d         58  %    CI:        67.42   %    70 - 86                                                          FL/HC:      20.3   %    16.8 - 19.8  HC:      184.8  mm     G. Age:  20w 6d         73  %    HC/AC:      1.14        1.09 - 1.39  AC:       162   mm     G. Age:  21w 2d         80  %    FL/BPD:     79.1   %  FL:       37.5  mm     G. Age:  22w 0d         93  %  FL/AC:      23.1   %    20 - 24  HUM:      35.9  mm     G. Age:  22w 4d       > 95  %  CER:      22.6  mm     G. Age:  21w 1d         91  %  LV:        5.5  mm  CM:          4  mm  Est. FW:     426  gm    0 lb 15 oz      98  % ---------------------------------------------------------------------- OB History  Gravidity:    3         Term:   2        Prem:   0        SAB:   0  TOP:          0       Ectopic:  0        Living: 2 ---------------------------------------------------------------------- Gestational Age  LMP:           20w 1d        Date:  03/21/20                 EDD:   12/26/20  U/S Today:     21w 1d                                        EDD:   12/19/20  Best:          20w 1d     Det. By:  LMP  (03/21/20)          EDD:   12/26/20 ---------------------------------------------------------------------- Anatomy  Cranium:               Appears normal         LVOT:                   Appears normal  Cavum:                 Appears normal         Aortic Arch:            Appears normal  Ventricles:            Appears normal         Ductal Arch:            Appears normal  Choroid Plexus:        Appears normal         Diaphragm:              Appears normal  Cerebellum:            Appears normal         Stomach:                Appears normal, left  sided  Posterior Fossa:       Appears normal         Abdomen:                Appears normal  Nuchal Fold:           Appears normal         Abdominal Wall:         Appears nml (cord                                                                         insert, abd wall)  Face:                  Appears normal         Cord Vessels:           Appears normal (3                         (orbits and profile)                           vessel cord)  Lips:                  Appears normal         Kidneys:                Appear normal  Palate:                Appears normal         Bladder:                Appears normal  Thoracic:              Appears normal         Spine:                  Appears normal  Heart:                 Appears normal         Upper Extremities:      Appears normal                         (4CH, axis, and                         situs)  RVOT:                  Appears normal         Lower Extremities:      Appears normal  Other:  SVC IVC appears normal. 3VV/T appears normal. Fetus appears to          be female. Heels and 5th digit visualized. Nasal bone visualized. ---------------------------------------------------------------------- Cervix Uterus Adnexa  Cervix  Length:           3.45  cm.  Normal appearance by transabdominal scan.  Adnexa  No abnormality visualized. ---------------------------------------------------------------------- Comments  This patient was seen for a detailed fetal anatomy scan.  She denies any significant past medical history and denies  any problems in her current pregnancy.  She had a cell free DNA test earlier in her pregnancy which  indicated a low risk for trisomy 49, 32, and 13. A female fetus  is predicted.  She was informed that the fetal growth and amniotic fluid  level were appropriate for her gestational age.  There were no obvious fetal anomalies noted on today's  ultrasound exam.  The patient was informed that anomalies may be missed due  to technical limitations. If the fetus is in a suboptimal position  or maternal habitus is increased, visualization of the fetus in  the maternal uterus may be impaired.  Follow up as indicated.  All conversations were held with the patient today with the  help of an  Arabic interpreter. ----------------------------------------------------------------------                   Ma Rings, MD Electronically Signed Final Report   08/09/2020 03:47 pm ----------------------------------------------------------------------   Assessment and Plan:  Pregnancy: [id number] at [redacted]w[redacted]d 1. Encounter for supervision of low-risk pregnancy in second trimester Anticipatory guidance given for next appointment with glucola testing No constipation. Watch left side pain - may be normal aches and pains that resolve as the pregnancy progresses if it is due to round ligament pain. Call the office if the pain worsens - can be seen of an additional visit if having problems. Vomiting has improved.  No longer a problem. 2. Language barrier Interpreter on the virtual visit for the entire visit.  3. Heartburn during pregnancy, antepartum Advised to try OTC Tums for treatment and if it is not working, can look at other medications to help.   Preterm labor symptoms and general obstetric precautions including but not limited to vaginal bleeding, contractions, leaking of fluid and fetal movement were reviewed in detail with the patient. I discussed the assessment and treatment plan with the patient. The patient was provided an opportunity to ask questions and all were answered. The patient agreed with the plan and demonstrated an understanding of the instructions. The patient was advised to call back or seek an in-person office evaluation/go to MAU at [facility] for any urgent or concerning symptoms. Please refer to After Visit Summary for other counseling recommendations.   I provided 8 minutes of face-to-face time during this encounter.  Return in about 4 weeks (around 09/28/2020) for early am appointment for fasting 2 hr and ROB.  No future appointments. at time note was closed.  Currie Paris, NP Center for [employer], [facility]

## 2020-10-06 ENCOUNTER — Other Ambulatory Visit: Payer: Self-pay | Admitting: *Deleted

## 2020-10-06 DIAGNOSIS — Z3490 Encounter for supervision of normal pregnancy, unspecified, unspecified trimester: Secondary | ICD-10-CM

## 2020-10-08 ENCOUNTER — Encounter: Payer: Self-pay | Admitting: Family Medicine

## 2020-10-08 ENCOUNTER — Other Ambulatory Visit: Payer: Medicaid Other

## 2020-10-08 ENCOUNTER — Ambulatory Visit (INDEPENDENT_AMBULATORY_CARE_PROVIDER_SITE_OTHER): Payer: Medicaid Other | Admitting: Family Medicine

## 2020-10-08 VITALS — BP 113/76 | HR 108 | Wt 142.7 lb

## 2020-10-08 DIAGNOSIS — Z3492 Encounter for supervision of normal pregnancy, unspecified, second trimester: Secondary | ICD-10-CM | POA: Diagnosis not present

## 2020-10-08 DIAGNOSIS — Z3490 Encounter for supervision of normal pregnancy, unspecified, unspecified trimester: Secondary | ICD-10-CM

## 2020-10-08 DIAGNOSIS — Z23 Encounter for immunization: Secondary | ICD-10-CM

## 2020-10-08 DIAGNOSIS — Z789 Other specified health status: Secondary | ICD-10-CM

## 2020-10-08 MED ORDER — CYCLOBENZAPRINE HCL 5 MG PO TABS: 5.0000 mg | ORAL_TABLET | Freq: Every day | ORAL | 0 refills | Status: DC

## 2020-10-08 NOTE — Progress Notes (Signed)
   Subjective:  Elizabeth Odonnell is a 35 y.o. G3P2002 at [redacted]w[redacted]d being seen today for ongoing prenatal care.  She is currently monitored for the following issues for this low-risk pregnancy and has Language barrier and Supervision of low-risk pregnancy on their problem list.  Patient reports no complaints.  Contractions: Irritability.  .  Movement: Present. Denies leaking of fluid.   The following portions of the patient's history were reviewed and updated as appropriate: allergies, current medications, past family history, past medical history, past social history, past surgical history and problem list. Problem list updated.  Objective:   Vitals:   10/08/20 0856  BP: 113/76  Pulse: (!) 108  Weight: 142 lb 11.2 oz (64.7 kg)    Fetal Status: Fetal Heart Rate (bpm): 145   Movement: Present     General:  Alert, oriented and cooperative. Patient is in no acute distress.  Skin: Skin is warm and dry. No rash noted.   Cardiovascular: Normal heart rate noted  Respiratory: Normal respiratory effort, no problems with respiration noted  Abdomen: Soft, gravid, appropriate for gestational age. Pain/Pressure: Present     Pelvic:       Cervical exam deferred        Extremities: Normal range of motion.  Edema: None  Mental Status: Normal mood and affect. Normal behavior. Normal judgment and thought content.   Urinalysis:      Assessment and Plan:  Pregnancy: G3P2002 at [redacted]w[redacted]d  1. Encounter for supervision of low-risk pregnancy in second trimester BP and FHR normal TDaP given 28 wk labs today Significant L neck pain with muscle spasm, short course of flexeril  2. Language barrier Arabic  Preterm labor symptoms and general obstetric precautions including but not limited to vaginal bleeding, contractions, leaking of fluid and fetal movement were reviewed in detail with the patient. Please refer to After Visit Summary for other counseling recommendations.  Return in 2 weeks (on  10/22/2020) for [facility], ob visit.   Venora Maples, MD

## 2020-10-08 NOTE — Patient Instructions (Signed)
 Contraception Choices Contraception, also called birth control, refers to methods or devices that prevent pregnancy. Hormonal methods Contraceptive implant A contraceptive implant is a thin, plastic tube that contains a hormone that prevents pregnancy. It is different from an intrauterine device (IUD). It is inserted into the upper part of the arm by a health care provider. Implants can be effective for up to 3 years. Progestin-only injections Progestin-only injections are injections of progestin, a synthetic form of the hormone progesterone. They are given every 3 months by a health care provider. Birth control pills Birth control pills are pills that contain hormones that prevent pregnancy. They must be taken once a day, preferably at the same time each day. A prescription is needed to use this method of contraception. Birth control patch The birth control patch contains hormones that prevent pregnancy. It is placed on the skin and must be changed once a week for three weeks and removed on the fourth week. A prescription is needed to use this method of contraception. Vaginal ring A vaginal ring contains hormones that prevent pregnancy. It is placed in the vagina for three weeks and removed on the fourth week. After that, the process is repeated with a new ring. A prescription is needed to use this method of contraception. Emergency contraceptive Emergency contraceptives prevent pregnancy after unprotected sex. They come in pill form and can be taken up to 5 days after sex. They work best the sooner they are taken after having sex. Most emergency contraceptives are available without a prescription. This method should not be used as your only form of birth control.   Barrier methods Female condom A female condom is a thin sheath that is worn over the penis during sex. Condoms keep sperm from going inside a woman's body. They can be used with a sperm-killing substance (spermicide) to increase their  effectiveness. They should be thrown away after one use. Female condom A female condom is a soft, loose-fitting sheath that is put into the vagina before sex. The condom keeps sperm from going inside a woman's body. They should be thrown away after one use. Diaphragm A diaphragm is a soft, dome-shaped barrier. It is inserted into the vagina before sex, along with a spermicide. The diaphragm blocks sperm from entering the uterus, and the spermicide kills sperm. A diaphragm should be left in the vagina for 6-8 hours after sex and removed within 24 hours. A diaphragm is prescribed and fitted by a health care provider. A diaphragm should be replaced every 1-2 years, after giving birth, after gaining more than 15 lb (6.8 kg), and after pelvic surgery. Cervical cap A cervical cap is a round, soft latex or plastic cup that fits over the cervix. It is inserted into the vagina before sex, along with spermicide. It blocks sperm from entering the uterus. The cap should be left in place for 6-8 hours after sex and removed within 48 hours. A cervical cap must be prescribed and fitted by a health care provider. It should be replaced every 2 years. Sponge A sponge is a soft, circular piece of polyurethane foam with spermicide in it. The sponge helps block sperm from entering the uterus, and the spermicide kills sperm. To use it, you make it wet and then insert it into the vagina. It should be inserted before sex, left in for at least 6 hours after sex, and removed and thrown away within 30 hours. Spermicides Spermicides are chemicals that kill or block sperm from entering the   cervix and uterus. They can come as a cream, jelly, suppository, foam, or tablet. A spermicide should be inserted into the vagina with an applicator at least 10-15 minutes before sex to allow time for it to work. The process must be repeated every time you have sex. Spermicides do not require a prescription.   Intrauterine  contraception Intrauterine device (IUD) An IUD is a T-shaped device that is put in a woman's uterus. There are two types:  Hormone IUD.This type contains progestin, a synthetic form of the hormone progesterone. This type can stay in place for 3-5 years.  Copper IUD.This type is wrapped in copper wire. It can stay in place for 10 years. Permanent methods of contraception Female tubal ligation In this method, a woman's fallopian tubes are sealed, tied, or blocked during surgery to prevent eggs from traveling to the uterus. Hysteroscopic sterilization In this method, a small, flexible insert is placed into each fallopian tube. The inserts cause scar tissue to form in the fallopian tubes and block them, so sperm cannot reach an egg. The procedure takes about 3 months to be effective. Another form of birth control must be used during those 3 months. Female sterilization This is a procedure to tie off the tubes that carry sperm (vasectomy). After the procedure, the man can still ejaculate fluid (semen). Another form of birth control must be used for 3 months after the procedure. Natural planning methods Natural family planning In this method, a couple does not have sex on days when the woman could become pregnant. Calendar method In this method, the woman keeps track of the length of each menstrual cycle, identifies the days when pregnancy can happen, and does not have sex on those days. Ovulation method In this method, a couple avoids sex during ovulation. Symptothermal method This method involves not having sex during ovulation. The woman typically checks for ovulation by watching changes in her temperature and in the consistency of cervical mucus. Post-ovulation method In this method, a couple waits to have sex until after ovulation. Where to find more information  Centers for Disease Control and Prevention: www.cdc.gov Summary  Contraception, also called birth control, refers to methods or  devices that prevent pregnancy.  Hormonal methods of contraception include implants, injections, pills, patches, vaginal rings, and emergency contraceptives.  Barrier methods of contraception can include female condoms, female condoms, diaphragms, cervical caps, sponges, and spermicides.  There are two types of IUDs (intrauterine devices). An IUD can be put in a woman's uterus to prevent pregnancy for 3-5 years.  Permanent sterilization can be done through a procedure for males and females. Natural family planning methods involve nothaving sex on days when the woman could become pregnant. This information is not intended to replace advice given to you by your health care provider. Make sure you discuss any questions you have with your health care provider. Document Revised: 11/03/2019 Document Reviewed: 11/03/2019 Elsevier Patient Education  2021 [employer] Inc.   Breastfeeding  Choosing to breastfeed is one of the best decisions you can make for yourself and your baby. A change in hormones during pregnancy causes your breasts to make breast milk in your milk-producing glands. Hormones prevent breast milk from being released before your baby is born. They also prompt milk flow after birth. Once breastfeeding has begun, thoughts of your baby, as well as his or her sucking or crying, can stimulate the release of milk from your milk-producing glands. Benefits of breastfeeding Research shows that breastfeeding offers many health benefits   for infants and mothers. It also offers a cost-free and convenient way to feed your baby. For your baby  Your first milk (colostrum) helps your baby's digestive system to function better.  Special cells in your milk (antibodies) help your baby to fight off infections.  Breastfed babies are less likely to develop asthma, allergies, obesity, or type 2 diabetes. They are also at lower risk for sudden infant death syndrome (SIDS).  Nutrients in breast milk are better  able to meet your baby's needs compared to infant formula.  Breast milk improves your baby's brain development. For you  Breastfeeding helps to create a very special bond between you and your baby.  Breastfeeding is convenient. Breast milk costs nothing and is always available at the correct temperature.  Breastfeeding helps to burn calories. It helps you to lose the weight that you gained during pregnancy.  Breastfeeding makes your uterus return faster to its size before pregnancy. It also slows bleeding (lochia) after you give birth.  Breastfeeding helps to lower your risk of developing type 2 diabetes, osteoporosis, rheumatoid arthritis, cardiovascular disease, and breast, ovarian, uterine, and endometrial cancer later in life. Breastfeeding basics Starting breastfeeding  Find a comfortable place to sit or lie down, with your neck and back well-supported.  Place a pillow or a rolled-up blanket under your baby to bring him or her to the level of your breast (if you are seated). Nursing pillows are specially designed to help support your arms and your baby while you breastfeed.  Make sure that your baby's tummy (abdomen) is facing your abdomen.  Gently massage your breast. With your fingertips, massage from the outer edges of your breast inward toward the nipple. This encourages milk flow. If your milk flows slowly, you may need to continue this action during the feeding.  Support your breast with 4 fingers underneath and your thumb above your nipple (make the letter "C" with your hand). Make sure your fingers are well away from your nipple and your baby's mouth.  Stroke your baby's lips gently with your finger or nipple.  When your baby's mouth is open wide enough, quickly bring your baby to your breast, placing your entire nipple and as much of the areola as possible into your baby's mouth. The areola is the colored area around your nipple. ? More areola should be visible above your  baby's upper lip than below the lower lip. ? Your baby's lips should be opened and extended outward (flanged) to ensure an adequate, comfortable latch. ? Your baby's tongue should be between his or her lower gum and your breast.  Make sure that your baby's mouth is correctly positioned around your nipple (latched). Your baby's lips should create a seal on your breast and be turned out (everted).  It is common for your baby to suck about 2-3 minutes in order to start the flow of breast milk. Latching Teaching your baby how to latch onto your breast properly is very important. An improper latch can cause nipple pain, decreased milk supply, and poor weight gain in your baby. Also, if your baby is not latched onto your nipple properly, he or she may swallow some air during feeding. This can make your baby fussy. Burping your baby when you switch breasts during the feeding can help to get rid of the air. However, teaching your baby to latch on properly is still the best way to prevent fussiness from swallowing air while breastfeeding. Signs that your baby has successfully latched onto   your nipple  Silent tugging or silent sucking, without causing you pain. Infant's lips should be extended outward (flanged).  Swallowing heard between every 3-4 sucks once your milk has started to flow (after your let-down milk reflex occurs).  Muscle movement above and in front of his or her ears while sucking. Signs that your baby has not successfully latched onto your nipple  Sucking sounds or smacking sounds from your baby while breastfeeding.  Nipple pain. If you think your baby has not latched on correctly, slip your finger into the corner of your baby's mouth to break the suction and place it between your baby's gums. Attempt to start breastfeeding again. Signs of successful breastfeeding Signs from your baby  Your baby will gradually decrease the number of sucks or will completely stop sucking.  Your baby  will fall asleep.  Your baby's body will relax.  Your baby will retain a small amount of milk in his or her mouth.  Your baby will let go of your breast by himself or herself. Signs from you  Breasts that have increased in firmness, weight, and size 1-3 hours after feeding.  Breasts that are softer immediately after breastfeeding.  Increased milk volume, as well as a change in milk consistency and color by the fifth day of breastfeeding.  Nipples that are not sore, cracked, or bleeding. Signs that your baby is getting enough milk  Wetting at least 1-2 diapers during the first 24 hours after birth.  Wetting at least 5-6 diapers every 24 hours for the first week after birth. The urine should be clear or pale yellow by the age of 5 days.  Wetting 6-8 diapers every 24 hours as your baby continues to grow and develop.  At least 3 stools in a 24-hour period by the age of 5 days. The stool should be soft and yellow.  At least 3 stools in a 24-hour period by the age of 7 days. The stool should be seedy and yellow.  No loss of weight greater than 10% of birth weight during the first 3 days of life.  Average weight gain of 4-7 oz (113-198 g) per week after the age of 4 days.  Consistent daily weight gain by the age of 5 days, without weight loss after the age of 2 weeks. After a feeding, your baby may spit up a small amount of milk. This is normal. Breastfeeding frequency and duration Frequent feeding will help you make more milk and can prevent sore nipples and extremely full breasts (breast engorgement). Breastfeed when you feel the need to reduce the fullness of your breasts or when your baby shows signs of hunger. This is called "breastfeeding on demand." Signs that your baby is hungry include:  Increased alertness, activity, or restlessness.  Movement of the head from side to side.  Opening of the mouth when the corner of the mouth or cheek is stroked (rooting).  Increased  sucking sounds, smacking lips, cooing, sighing, or squeaking.  Hand-to-mouth movements and sucking on fingers or hands.  Fussing or crying. Avoid introducing a pacifier to your baby in the first 4-6 weeks after your baby is born. After this time, you may choose to use a pacifier. Research has shown that pacifier use during the first year of a baby's life decreases the risk of sudden infant death syndrome (SIDS). Allow your baby to feed on each breast as long as he or she wants. When your baby unlatches or falls asleep while feeding from the   first breast, offer the second breast. Because newborns are often sleepy in the first few weeks of life, you may need to awaken your baby to get him or her to feed. Breastfeeding times will vary from baby to baby. However, the following rules can serve as a guide to help you make sure that your baby is properly fed:  Newborns (babies 4 weeks of age or younger) may breastfeed every 1-3 hours.  Newborns should not go without breastfeeding for longer than 3 hours during the day or 5 hours during the night.  You should breastfeed your baby a minimum of 8 times in a 24-hour period. Breast milk pumping Pumping and storing breast milk allows you to make sure that your baby is exclusively fed your breast milk, even at times when you are unable to breastfeed. This is especially important if you go back to work while you are still breastfeeding, or if you are not able to be present during feedings. Your lactation consultant can help you find a method of pumping that works best for you and give you guidelines about how long it is safe to store breast milk.      Caring for your breasts while you breastfeed Nipples can become dry, cracked, and sore while breastfeeding. The following recommendations can help keep your breasts moisturized and healthy:  Avoid using soap on your nipples.  Wear a supportive bra designed especially for nursing. Avoid wearing underwire-style  bras or extremely tight bras (sports bras).  Air-dry your nipples for 3-4 minutes after each feeding.  Use only cotton bra pads to absorb leaked breast milk. Leaking of breast milk between feedings is normal.  Use lanolin on your nipples after breastfeeding. Lanolin helps to maintain your skin's normal moisture barrier. Pure lanolin is not harmful (not toxic) to your baby. You may also hand express a few drops of breast milk and gently massage that milk into your nipples and allow the milk to air-dry. In the first few weeks after giving birth, some women experience breast engorgement. Engorgement can make your breasts feel heavy, warm, and tender to the touch. Engorgement peaks within 3-5 days after you give birth. The following recommendations can help to ease engorgement:  Completely empty your breasts while breastfeeding or pumping. You may want to start by applying warm, moist heat (in the shower or with warm, water-soaked hand towels) just before feeding or pumping. This increases circulation and helps the milk flow. If your baby does not completely empty your breasts while breastfeeding, pump any extra milk after he or she is finished.  Apply ice packs to your breasts immediately after breastfeeding or pumping, unless this is too uncomfortable for you. To do this: ? Put ice in a plastic bag. ? Place a towel between your skin and the bag. ? Leave the ice on for 20 minutes, 2-3 times a day.  Make sure that your baby is latched on and positioned properly while breastfeeding. If engorgement persists after 48 hours of following these recommendations, contact your health care provider or a lactation consultant. Overall health care recommendations while breastfeeding  Eat 3 healthy meals and 3 snacks every day. Well-nourished mothers who are breastfeeding need an additional 450-500 calories a day. You can meet this requirement by increasing the amount of a balanced diet that you eat.  Drink  enough water to keep your urine pale yellow or clear.  Rest often, relax, and continue to take your prenatal vitamins to prevent fatigue, stress, and low   vitamin and mineral levels in your body (nutrient deficiencies).  Do not use any products that contain nicotine or tobacco, such as cigarettes and e-cigarettes. Your baby may be harmed by chemicals from cigarettes that pass into breast milk and exposure to secondhand smoke. If you need help quitting, ask your health care provider.  Avoid alcohol.  Do not use illegal drugs or marijuana.  Talk with your health care provider before taking any medicines. These include over-the-counter and prescription medicines as well as vitamins and herbal supplements. Some medicines that may be harmful to your baby can pass through breast milk.  It is possible to become pregnant while breastfeeding. If birth control is desired, ask your health care provider about options that will be safe while breastfeeding your baby. Where to find more information: [employer]: www.llli.org Contact a health care provider if:  You feel like you want to stop breastfeeding or have become frustrated with breastfeeding.  Your nipples are cracked or bleeding.  Your breasts are red, tender, or warm.  You have: ? Painful breasts or nipples. ? A swollen area on either breast. ? A fever or chills. ? Nausea or vomiting. ? Drainage other than breast milk from your nipples.  Your breasts do not become full before feedings by the fifth day after you give birth.  You feel sad and depressed.  Your baby is: ? Too sleepy to eat well. ? Having trouble sleeping. ? More than 1 week old and wetting fewer than 6 diapers in a 24-hour period. ? Not gaining weight by 5 days of age.  Your baby has fewer than 3 stools in a 24-hour period.  Your baby's skin or the white parts of his or her eyes become yellow. Get help right away if:  Your baby is overly tired  (lethargic) and does not want to wake up and feed.  Your baby develops an unexplained fever. Summary  Breastfeeding offers many health benefits for infant and mothers.  Try to breastfeed your infant when he or she shows early signs of hunger.  Gently tickle or stroke your baby's lips with your finger or nipple to allow the baby to open his or her mouth. Bring the baby to your breast. Make sure that much of the areola is in your baby's mouth. Offer one side and burp the baby before you offer the other side.  Talk with your health care provider or lactation consultant if you have questions or you face problems as you breastfeed. This information is not intended to replace advice given to you by your health care provider. Make sure you discuss any questions you have with your health care provider. Document Revised: 08/23/2017 Document Reviewed: 06/30/2016 Elsevier Patient Education  2021 Elsevier Inc.  

## 2020-10-09 LAB — CBC
Hematocrit: 37.3 % (ref 34.0–46.6)
Hemoglobin: 12.1 g/dL (ref 11.1–15.9)
MCH: 25.7 pg — ABNORMAL LOW (ref 26.6–33.0)
MCHC: 32.4 g/dL (ref 31.5–35.7)
MCV: 79 fL (ref 79–97)
Platelets: 215 10*3/uL (ref 150–450)
RBC: 4.71 x10E6/uL (ref 3.77–5.28)
RDW: 17.1 % — ABNORMAL HIGH (ref 11.7–15.4)
WBC: 8.2 10*3/uL (ref 3.4–10.8)

## 2020-10-09 LAB — HIV ANTIBODY (ROUTINE TESTING W REFLEX): HIV Screen 4th Generation wRfx: NONREACTIVE

## 2020-10-09 LAB — GLUCOSE TOLERANCE, 2 HOURS W/ 1HR
Glucose, 1 hour: 161 mg/dL (ref 65–179)
Glucose, 2 hour: 123 mg/dL (ref 65–152)
Glucose, Fasting: 75 mg/dL (ref 65–91)

## 2020-10-09 LAB — RPR: RPR Ser Ql: NONREACTIVE

## 2020-10-21 ENCOUNTER — Ambulatory Visit (INDEPENDENT_AMBULATORY_CARE_PROVIDER_SITE_OTHER): Payer: Medicaid Other | Admitting: Certified Nurse Midwife

## 2020-10-21 ENCOUNTER — Other Ambulatory Visit: Payer: Self-pay

## 2020-10-21 VITALS — BP 109/64 | HR 97 | Wt 146.0 lb

## 2020-10-21 DIAGNOSIS — Z3493 Encounter for supervision of normal pregnancy, unspecified, third trimester: Secondary | ICD-10-CM

## 2020-10-21 DIAGNOSIS — Z3A30 30 weeks gestation of pregnancy: Secondary | ICD-10-CM

## 2020-10-21 NOTE — Progress Notes (Signed)
   PRENATAL VISIT NOTE  Subjective:  Elizabeth Odonnell is a 35 y.o. G3P2002 at [redacted]w[redacted]d being seen today for ongoing prenatal care.  She is currently monitored for the following issues for this low-risk pregnancy and has Language barrier and Supervision of low-risk pregnancy on their problem list.  Patient reports occasional contractions.  Contractions: Not present. Vag. Bleeding: None.  Movement: Present. Denies leaking of fluid.   The following portions of the patient's history were reviewed and updated as appropriate: allergies, current medications, past family history, past medical history, past social history, past surgical history and problem list.   Arabic interpreter Elizabeth Odonnell) present for interpretation services  Objective:   Vitals:   10/21/20 1033  BP: 109/64  Pulse: 97  Weight: 146 lb (66.2 kg)    Fetal Status: Fetal Heart Rate (bpm): 147 Fundal Height: 30 cm Movement: Present     General:  Alert, oriented and cooperative. Patient is in no acute distress.  Skin: Skin is warm and dry. No rash noted.   Cardiovascular: Normal heart rate noted  Respiratory: Normal respiratory effort, no problems with respiration noted  Abdomen: Soft, gravid, appropriate for gestational age.  Pain/Pressure: Absent     Pelvic: Cervical exam deferred        Extremities: Normal range of motion.  Edema: None  Mental Status: Normal mood and affect. Normal behavior. Normal judgment and thought content.   Assessment and Plan:  Pregnancy: G3P2002 at [redacted]w[redacted]d 1. Encounter for supervision of low-risk pregnancy in third trimester - Doing well, feeling vigorous fetal movement.  2. [redacted] weeks gestation of pregnancy - Routine OB care - Identifies contractions happening mostly on right side of uterus. By Leopold's baby is breech and centered on right side. Explained that often at this stage of pregnancy, baby is trying to turn to vertex position which causes some contractions as uterus facilitates  movement. Demonstrated stretches/maternal positioning to help turn baby. Pt expressed understanding.  Preterm labor symptoms and general obstetric precautions including but not limited to vaginal bleeding, contractions, leaking of fluid and fetal movement were reviewed in detail with the patient. Please refer to After Visit Summary for other counseling recommendations.   Return in about 2 weeks (around 11/04/2020) for IN-PERSON, LOB.  Future Appointments  Date Time Provider Department Center  11/04/2020 10:15 AM Bernerd Limbo, CNM [facility]    Bernerd Limbo, CNM

## 2020-11-04 ENCOUNTER — Other Ambulatory Visit: Payer: Self-pay

## 2020-11-04 ENCOUNTER — Ambulatory Visit (INDEPENDENT_AMBULATORY_CARE_PROVIDER_SITE_OTHER): Payer: Medicaid Other | Admitting: Certified Nurse Midwife

## 2020-11-04 VITALS — BP 113/77 | HR 106 | Wt 147.5 lb

## 2020-11-04 DIAGNOSIS — Z3A32 32 weeks gestation of pregnancy: Secondary | ICD-10-CM

## 2020-11-04 DIAGNOSIS — Z3493 Encounter for supervision of normal pregnancy, unspecified, third trimester: Secondary | ICD-10-CM

## 2020-11-04 NOTE — Progress Notes (Signed)
Patient stated that she feels like her stomach is "tight" and has been feeling this for about a week. Patient also stated that she has been felling baby move but not like pervious weeks. She stated that last week were "big" movements from baby and this week sit has been "little" movements

## 2020-11-05 NOTE — Progress Notes (Signed)
   PRENATAL VISIT NOTE  Subjective:  Elizabeth Odonnell is a 35 y.o. G3P2002 at [redacted]w[redacted]d being seen today for ongoing prenatal care.  She is currently monitored for the following issues for this low-risk pregnancy and has Language barrier and Supervision of low-risk pregnancy on their problem list.  Patient reports some tightness and fetal movements have changed.  Contractions: Irritability. Vag. Bleeding: None.  Movement: Present. Denies leaking of fluid.   The following portions of the patient's history were reviewed and updated as appropriate: allergies, current medications, past family history, past medical history, past social history, past surgical history and problem list.   Objective:   Vitals:   11/04/20 1027  BP: 113/77  Pulse: (!) 106  Weight: 147 lb 8 oz (66.9 kg)    Fetal Status: Fetal Heart Rate (bpm): 161 Fundal Height: 32 cm Movement: Present  Presentation: Vertex  General:  Alert, oriented and cooperative. Patient is in no acute distress.  Skin: Skin is warm and dry. No rash noted.   Cardiovascular: Normal heart rate noted  Respiratory: Normal respiratory effort, no problems with respiration noted  Abdomen: Soft, gravid, appropriate for gestational age.  Pain/Pressure: Absent     Pelvic: Cervical exam deferred        Extremities: Normal range of motion.  Edema: None  Mental Status: Normal mood and affect. Normal behavior. Normal judgment and thought content.   Assessment and Plan:  Pregnancy: G3P2002 at [redacted]w[redacted]d 1. Supervision of low-risk pregnancy, third trimester - Doing well, feeling fetal movements but differently. Upon Leopold's baby now head-down. Suspect baby shifted over the past two weeks causing some BH. Vigorous fetal movement noted on exam and while getting Doppler FHT.  2. [redacted] weeks gestation of pregnancy - Routine OB care  Preterm labor symptoms and general obstetric precautions including but not limited to vaginal bleeding, contractions, leaking  of fluid and fetal movement were reviewed in detail with the patient. Please refer to After Visit Summary for other counseling recommendations.   Return in about 2 weeks (around 11/18/2020) for IN-PERSON, LOB.  Future Appointments  Date Time Provider Department Center  11/24/2020 10:35 AM [name], MD [facility]    Bernerd Limbo, CNM

## 2020-11-24 ENCOUNTER — Other Ambulatory Visit: Payer: Self-pay

## 2020-11-24 ENCOUNTER — Ambulatory Visit (INDEPENDENT_AMBULATORY_CARE_PROVIDER_SITE_OTHER): Payer: Medicaid Other | Admitting: Family Medicine

## 2020-11-24 VITALS — BP 116/74 | HR 96 | Wt 151.0 lb

## 2020-11-24 DIAGNOSIS — Z3493 Encounter for supervision of normal pregnancy, unspecified, third trimester: Secondary | ICD-10-CM

## 2020-11-24 DIAGNOSIS — Z789 Other specified health status: Secondary | ICD-10-CM

## 2020-11-24 NOTE — Progress Notes (Signed)
   PRENATAL VISIT NOTE  Subjective:  Elizabeth Odonnell is a 35 y.o. G3P2002 at [redacted]w[redacted]d being seen today for ongoing prenatal care.  She is currently monitored for the following issues for this low-risk pregnancy and has Language barrier and Supervision of low-risk pregnancy on their problem list.  Patient reports no complaints.  Contractions: Irritability. Vag. Bleeding: None.  Movement: Present. Denies leaking of fluid.   The following portions of the patient's history were reviewed and updated as appropriate: allergies, current medications, past family history, past medical history, past social history, past surgical history and problem list.   Objective:   Vitals:   11/24/20 1058  BP: 116/74  Pulse: 96  Weight: 151 lb (68.5 kg)    Fetal Status: Fetal Heart Rate (bpm): 159 Fundal Height: 37 cm Movement: Present     General:  Alert, oriented and cooperative. Patient is in no acute distress.  Skin: Skin is warm and dry. No rash noted.   Cardiovascular: Normal heart rate noted  Respiratory: Normal respiratory effort, no problems with respiration noted  Abdomen: Soft, gravid, appropriate for gestational age.  Very full bladder Pain/Pressure: Present     Pelvic: Cervical exam deferred        Extremities: Normal range of motion.  Edema: None  Mental Status: Normal mood and affect. Normal behavior. Normal judgment and thought content.   Assessment and Plan:  Pregnancy: G3P2002 at [redacted]w[redacted]d 1. Encounter for supervision of low-risk pregnancy in third trimester Continue routine prenatal care. Cultures next visit.  2. Language barrier Arabic interpreter: In person used   Preterm labor symptoms and general obstetric precautions including but not limited to vaginal bleeding, contractions, leaking of fluid and fetal movement were reviewed in detail with the patient. Please refer to After Visit Summary for other counseling recommendations.   Return in 1 week (on 12/01/2020) for [facility],  in person needs cultures.  Future Appointments  Date Time Provider Department Center  11/30/2020  3:15 PM Berle Mull [facility]    [name], MD

## 2020-11-30 ENCOUNTER — Inpatient Hospital Stay (HOSPITAL_COMMUNITY)
Admission: AD | Admit: 2020-11-30 | Discharge: 2020-11-30 | Disposition: A | Discharge status: Home / Self Care | Payer: Medicaid Other | Attending: Obstetrics & Gynecology | Admitting: Obstetrics & Gynecology

## 2020-11-30 ENCOUNTER — Encounter: Payer: Self-pay | Admitting: *Deleted

## 2020-11-30 ENCOUNTER — Other Ambulatory Visit: Payer: Self-pay

## 2020-11-30 ENCOUNTER — Encounter (HOSPITAL_COMMUNITY): Payer: Self-pay | Admitting: Obstetrics & Gynecology

## 2020-11-30 ENCOUNTER — Other Ambulatory Visit (HOSPITAL_COMMUNITY)
Admission: RE | Admit: 2020-11-30 | Discharge: 2020-11-30 | Disposition: A | Discharge status: Home / Self Care | Payer: Medicaid Other | Source: Ambulatory Visit

## 2020-11-30 ENCOUNTER — Ambulatory Visit (INDEPENDENT_AMBULATORY_CARE_PROVIDER_SITE_OTHER): Payer: Medicaid Other

## 2020-11-30 VITALS — BP 111/72 | HR 101 | Wt 152.8 lb

## 2020-11-30 DIAGNOSIS — O4703 False labor before 37 completed weeks of gestation, third trimester: Secondary | ICD-10-CM | POA: Insufficient documentation

## 2020-11-30 DIAGNOSIS — O368330 Maternal care for abnormalities of the fetal heart rate or rhythm, third trimester, not applicable or unspecified: Secondary | ICD-10-CM | POA: Diagnosis not present

## 2020-11-30 DIAGNOSIS — Z79899 Other long term (current) drug therapy: Secondary | ICD-10-CM | POA: Insufficient documentation

## 2020-11-30 DIAGNOSIS — Z3A36 36 weeks gestation of pregnancy: Secondary | ICD-10-CM | POA: Diagnosis not present

## 2020-11-30 DIAGNOSIS — Z3689 Encounter for other specified antenatal screening: Secondary | ICD-10-CM

## 2020-11-30 DIAGNOSIS — O368133 Decreased fetal movements, third trimester, fetus 3: Secondary | ICD-10-CM

## 2020-11-30 DIAGNOSIS — Z3493 Encounter for supervision of normal pregnancy, unspecified, third trimester: Secondary | ICD-10-CM | POA: Diagnosis present

## 2020-11-30 DIAGNOSIS — O368130 Decreased fetal movements, third trimester, not applicable or unspecified: Secondary | ICD-10-CM | POA: Diagnosis not present

## 2020-11-30 LAB — FETAL NONSTRESS TEST

## 2020-11-30 LAB — OB RESULTS CONSOLE GC/CHLAMYDIA: Gonorrhea: NEGATIVE

## 2020-11-30 NOTE — Progress Notes (Signed)
NST for decreased fetal movement obtained and ran on paper only due to issue with OBIX and will be scanned into chart. Camelia Eng, CNM reviewed strip and agreed reassuring , not reactive with uc's. Camelia Eng, CNM instructed patient to go to MAU for evaluation / extended monitoring.  Karrissa Parchment,RN

## 2020-11-30 NOTE — Progress Notes (Addendum)
   PRENATAL VISIT NOTE  Subjective:  Elizabeth Odonnell is a 36 y.o. G3P2002 at [redacted]w[redacted]d being seen today for ongoing prenatal care. She is currently monitored for the following issues for this low-risk pregnancy and has Language barrier and Supervision of low-risk pregnancy on their problem list.  Patient reports decreased fetal movement for 4 days. Reports she has only felt baby move 5 times today. Contractions: Irritability. Vag. Bleeding: None.  Movement: (!) Decreased. Denies leaking of fluid.   The following portions of the patient's history were reviewed and updated as appropriate: allergies, current medications, past family history, past medical history, past social history, past surgical history and problem list.   Objective:   Vitals:   11/30/20 1537  BP: 111/72  Pulse: (!) 101  Weight: 152 lb 12.8 oz (69.3 kg)    Fetal Status: Fetal Heart Rate (bpm): 155 Fundal Height: 37 cm Movement: (!) Decreased     General:  Alert, oriented and cooperative. Patient is in no acute distress.  Skin: Skin is warm and dry. No rash noted.   Cardiovascular: Normal heart rate noted  Respiratory: Normal respiratory effort, no problems with respiration noted  Abdomen: Soft, gravid, appropriate for gestational age.  Pain/Pressure: Present     Pelvic: Cervical exam performed in the presence of a chaperone Dilation: 1 Effacement (%): Thick Station: -3  Extremities: Normal range of motion.  Edema: None  Mental Status: Normal mood and affect. Normal behavior. Normal judgment and thought content.   Assessment and Plan:  Pregnancy: G3P2002 at [redacted]w[redacted]d  1. [redacted] weeks gestation of pregnancy - Routine OB care  - Culture, beta strep (group b only) - Cervicovaginal ancillary only( [place])  2. Decreased fetal movements in third trimester, single or unspecified fetus - Decreased fetal movement for 4 days - NST in office reassuring but not reactive - FHR 145bpm, moderate variability, +10x10  accels, no decels - Given it is 5pm, will send patient to MAU for further monitoring. Dorathy Kinsman, CNM notified of expectant patient arrival   - Fetal nonstress test  3. Language barrier - In person Arabic interpretor used  Term labor symptoms and general obstetric precautions including but not limited to vaginal bleeding, contractions, leaking of fluid and fetal movement were reviewed in detail with the patient. Please refer to After Visit Summary for other counseling recommendations.   Return in about 1 week (around 12/07/2020).  Future Appointments  Date Time Provider Department Center  12/07/2020  8:55 AM Brand Males, CNM [facility]    Brand Males, CNM 11/30/20 5:00 PM

## 2020-11-30 NOTE — Progress Notes (Signed)
Patient stated that baby movement has decreased along with lower back pain for 3 days

## 2020-11-30 NOTE — MAU Note (Signed)
PT sent from office for monitoring d/t decreased fetal movement and non-reassuring NST.  Pt states cntx come and go. She denies LOF or VB. Some fetal movement, just not as normal.

## 2020-11-30 NOTE — MAU Provider Note (Signed)
History     CSN: [id number]  Arrival date and time: 11/30/20 1719   Event Date/Time   First Provider Initiated Contact with Patient 11/30/20 1819      Chief Complaint  Patient presents with   Decreased Fetal Movement   HPI Elizabeth Odonnell is a 35 y.o. G3P2002 at [redacted]w[redacted]d who presents to MAU from [facility] for evaluation of DFM and nonreactive NST. Patient endorses active fetal movement on arrival to MAU. She states she has not eaten today.   Patient endorses mild recurrent contractions in the setting of cervical exam in office. She requests cervical check.  Patient receives care with [facility].  OB History     Gravida  3   Para  2   Term  2   Preterm  0   AB  0   Living  2      SAB  0   IAB  0   Ectopic  0   Multiple      Live Births  2           Past Medical History:  Diagnosis Date   Medical history non-contributory    Pelvic pain     Past Surgical History:  Procedure Laterality Date   NO PAST SURGERIES      Family History  Problem Relation Age of Onset   Diabetes Mother     Social History   Tobacco Use   Smoking status: Never   Smokeless tobacco: Never  Vaping Use   Vaping Use: Never used  Substance Use Topics   Alcohol use: No   Drug use: No    Allergies:  Allergies  Allergen Reactions   Other Itching    Eggplant--itching in and around mouth    Medications Prior to Admission  Medication Sig Dispense Refill Last Dose   ferrous sulfate 325 (65 FE) MG tablet Take 1 tablet (325 mg total) by mouth every other day. 15 tablet 11 11/30/2020   prenatal vitamin w/FE, FA (PRENATAL 1 + 1) 27-1 MG TABS tablet Take 1 tablet by mouth daily at 12 noon. 30 tablet 11 11/30/2020   cyclobenzaprine (FLEXERIL) 5 MG tablet Take 1 tablet (5 mg total) by mouth at bedtime. 10 tablet 0     Review of Systems  Gastrointestinal:  Positive for abdominal pain.  All other systems reviewed and are negative. Physical Exam   Blood pressure 115/77, pulse  (!) 102, temperature 98.6 F (37 C), temperature source Oral, resp. rate 17, last menstrual period 03/21/2020, SpO2 97 %, unknown if currently breastfeeding.  Physical Exam Vitals and nursing note reviewed. Exam conducted with a chaperone present.  Constitutional:      Appearance: Normal appearance. She is not ill-appearing.  Cardiovascular:     Rate and Rhythm: Normal rate.     Pulses: Normal pulses.     Heart sounds: Normal heart sounds.  Pulmonary:     Effort: Pulmonary effort is normal.     Breath sounds: Normal breath sounds.  Abdominal:     Comments: Gravid  Skin:    Capillary Refill: Capillary refill takes less than 2 seconds.  Neurological:     Mental Status: She is alert and oriented to person, place, and time.  Psychiatric:        Mood and Affect: Mood normal.        Behavior: Behavior normal.        Thought Content: Thought content normal.        Judgment: Judgment  normal.    MAU Course  Procedures  --Patient given juice and crackers --Reactive tracing x 1 hour prolonged monitoring --Baseline 140, mod var, + 15 x 15 accels, no decels --Toco: irregular contractions, resolving with rest and PO hydration --Patient given NST button on arrival. Pushed button about 48 times during one hour of evaluation  Patient Vitals for the past 24 hrs:  BP Temp Temp src Pulse Resp SpO2  11/30/20 1916 115/71 -- -- (!) 101 16 99 %  11/30/20 1756 115/77 98.6 F (37 C) Oral (!) 102 17 97 %   Assessment and Plan  --35 y.o. G3P2002 at [redacted]w[redacted]d  --Reactive tracing x 1 hour prolonged monitoring --Pt reporting very active movement throughout period of evaluation --BPP not indicated --Cervix 1 externally, FT internally --Discharge home in stable condition  Calvert Cantor, CNM 11/30/2020, 8:13 PM

## 2020-12-01 LAB — CERVICOVAGINAL ANCILLARY ONLY
Chlamydia: NEGATIVE
Comment: NEGATIVE
Comment: NORMAL
Neisseria Gonorrhea: NEGATIVE

## 2020-12-04 LAB — CULTURE, BETA STREP (GROUP B ONLY): Strep Gp B Culture: NEGATIVE

## 2020-12-07 ENCOUNTER — Other Ambulatory Visit: Payer: Self-pay

## 2020-12-07 ENCOUNTER — Ambulatory Visit (INDEPENDENT_AMBULATORY_CARE_PROVIDER_SITE_OTHER): Payer: Medicaid Other

## 2020-12-07 VITALS — BP 113/76 | HR 106 | Wt 153.1 lb

## 2020-12-07 DIAGNOSIS — Z3493 Encounter for supervision of normal pregnancy, unspecified, third trimester: Secondary | ICD-10-CM

## 2020-12-07 DIAGNOSIS — Z789 Other specified health status: Secondary | ICD-10-CM

## 2020-12-07 NOTE — Progress Notes (Signed)
Patient complains of daily pelvic pain. She stated it feels like "cramps" and it happens when she walks.

## 2020-12-07 NOTE — Progress Notes (Signed)
   PRENATAL VISIT NOTE  Subjective:  Elizabeth Odonnell is a 35 y.o. G3P2002 at [redacted]w[redacted]d being seen today for ongoing prenatal care.  She is currently monitored for the following issues for this low-risk pregnancy and has Language barrier and Supervision of low-risk pregnancy on their problem list.  Patient reports intermittent irregular contractions and pelvic pressure, worse with movement. Contractions: Irritability. Vag. Bleeding: None.  Movement: Present. Denies leaking of fluid.   The following portions of the patient's history were reviewed and updated as appropriate: allergies, current medications, past family history, past medical history, past social history, past surgical history and problem list.   Objective:   Vitals:   12/07/20 0901  BP: 113/76  Pulse: (!) 106  Weight: 153 lb 1.6 oz (69.4 kg)    Fetal Status: Fetal Heart Rate (bpm): 156 Fundal Height: 37 cm Movement: Present     General:  Alert, oriented and cooperative. Patient is in no acute distress.  Skin: Skin is warm and dry. No rash noted.   Cardiovascular: Normal heart rate noted  Respiratory: Normal respiratory effort, no problems with respiration noted  Abdomen: Soft, gravid, appropriate for gestational age.  Pain/Pressure: Present     Pelvic: Cervical exam performed in the presence of a chaperone Dilation: 1 Effacement (%): Thick Station: -3  Extremities: Normal range of motion.  Edema: None  Mental Status: Normal mood and affect. Normal behavior. Normal judgment and thought content.   Assessment and Plan:  Pregnancy: G3P2002 at [redacted]w[redacted]d  1. Encounter for supervision of low-risk pregnancy in third trimester - Routine OB care. Doing well.  - Patient reporting intermittent pelvic pressure. Reassurance provided. Recommend stretching, support belt, PO hydration - Endorses active fetal movement - Requesting cervical exam today - Anticipatory guidance for upcoming appointment provided  2. Language  barrier - In person Arabic interpretor used for today's visit   Term labor symptoms and general obstetric precautions including but not limited to vaginal bleeding, contractions, leaking of fluid and fetal movement were reviewed in detail with the patient. Please refer to After Visit Summary for other counseling recommendations.   Return in about 1 week (around 12/14/2020).  Future Appointments  Date Time Provider Department Center  12/15/2020  1:15 PM Bernerd Limbo, CNM [facility]    Brand Males, CNM 12/07/20 9:30 AM

## 2020-12-15 ENCOUNTER — Other Ambulatory Visit: Payer: Self-pay

## 2020-12-15 ENCOUNTER — Ambulatory Visit (INDEPENDENT_AMBULATORY_CARE_PROVIDER_SITE_OTHER): Payer: Medicaid Other | Admitting: Certified Nurse Midwife

## 2020-12-15 VITALS — BP 116/79 | HR 93 | Wt 154.3 lb

## 2020-12-15 DIAGNOSIS — Z789 Other specified health status: Secondary | ICD-10-CM

## 2020-12-15 DIAGNOSIS — Z3493 Encounter for supervision of normal pregnancy, unspecified, third trimester: Secondary | ICD-10-CM

## 2020-12-15 DIAGNOSIS — Z3A38 38 weeks gestation of pregnancy: Secondary | ICD-10-CM

## 2020-12-15 DIAGNOSIS — Z758 Other problems related to medical facilities and other health care: Secondary | ICD-10-CM

## 2020-12-15 NOTE — Progress Notes (Signed)
   PRENATAL VISIT NOTE   Subjective:  Elizabeth Odonnell is a 35 y.o. G3P2002 at [redacted]w[redacted]d being seen today for ongoing prenatal care.  She is currently monitored for the following issues for this low-risk pregnancy and has Language barrier and Supervision of low-risk pregnancy on their problem list.  Patient reports no complaints. She endorses occasional braxton hicks contractions.  She is having in increase in clear vaginal discharge. She believes it is normal. She denies LOF, not having to wear a pad or panty liner. She states that it does not have any odor and she only notices it occasionally.   Contractions: Irritability. Vag. Bleeding: None.  Movement: Present. Denies leaking of fluid.   The following portions of the patient's history were reviewed and updated as appropriate: allergies, current medications, past family history, past medical history, past social history, past surgical history and problem list.   Objective:   Vitals:   12/15/20 1325  BP: 116/79  Pulse: 93  Weight: 70 kg    Fetal Status: Fetal Heart Rate (bpm): 156 Fundal Height: 38 cm Movement: Present     General:  Alert, oriented and cooperative. Patient is in no acute distress.  Skin: Skin is warm and dry. No rash noted.   Cardiovascular: Normal heart rate noted  Respiratory: Normal respiratory effort, no problems with respiration noted  Abdomen: Soft, gravid, appropriate for gestational age.  Pain/Pressure: Present     Pelvic: Cervical exam deferred        Extremities: Normal range of motion.  Edema: Trace  Mental Status: Normal mood and affect. Normal behavior. Normal judgment and thought content.   Assessment and Plan:  Pregnancy: G3P2002 at [redacted]w[redacted]d 1. Supervision of low-risk pregnancy, third trimester - [name] is doing well with active fetal movement.  - 2 previous IOL for postdates @ >41weeks. Discussed similar expectations with this pregnancy if spontaneous labor has not occurred by 41 weeks.   2.  [redacted] weeks gestation of pregnancy - Pt reassured of normal cervical mucous as the body prepares for labor.   3. Language Barrier * In-person Clinical research associate at bedside and utilized throughout the entire visit.   Preterm labor symptoms and general obstetric precautions including but not limited to vaginal bleeding, contractions, leaking of fluid and fetal movement were reviewed in detail with the patient. Please refer to After Visit Summary for other counseling recommendations.    Return in about 1 week (around 12/22/2020) for LROB.  No future appointments.  Signed:  Kazandra Forstrom Danella Deis) Suzie Portela, BSN, RNC-OB  Student Nurse-Midwife   12/15/2020  1:45 PM

## 2020-12-21 ENCOUNTER — Encounter (HOSPITAL_COMMUNITY): Payer: Self-pay | Admitting: Obstetrics and Gynecology

## 2020-12-21 ENCOUNTER — Other Ambulatory Visit: Payer: Self-pay

## 2020-12-21 ENCOUNTER — Inpatient Hospital Stay (HOSPITAL_COMMUNITY)
Admission: AD | Admit: 2020-12-21 | Discharge: 2020-12-22 | DRG: 807 | Disposition: A | Discharge status: Home / Self Care | Payer: Medicaid Other | Attending: Obstetrics and Gynecology | Admitting: Obstetrics and Gynecology

## 2020-12-21 ENCOUNTER — Inpatient Hospital Stay (HOSPITAL_COMMUNITY): Payer: Medicaid Other | Admitting: Anesthesiology

## 2020-12-21 DIAGNOSIS — Z3A39 39 weeks gestation of pregnancy: Secondary | ICD-10-CM

## 2020-12-21 DIAGNOSIS — Z20822 Contact with and (suspected) exposure to covid-19: Secondary | ICD-10-CM | POA: Diagnosis present

## 2020-12-21 DIAGNOSIS — O26893 Other specified pregnancy related conditions, third trimester: Secondary | ICD-10-CM | POA: Diagnosis present

## 2020-12-21 DIAGNOSIS — Z3493 Encounter for supervision of normal pregnancy, unspecified, third trimester: Secondary | ICD-10-CM

## 2020-12-21 DIAGNOSIS — Z30017 Encounter for initial prescription of implantable subdermal contraceptive: Secondary | ICD-10-CM | POA: Diagnosis not present

## 2020-12-21 LAB — CBC
HCT: 37.3 % (ref 36.0–46.0)
HCT: 39.5 % (ref 36.0–46.0)
Hemoglobin: 12.5 g/dL (ref 12.0–15.0)
Hemoglobin: 12.9 g/dL (ref 12.0–15.0)
MCH: 27.5 pg (ref 26.0–34.0)
MCH: 27.3 pg (ref 26.0–34.0)
MCHC: 33.5 g/dL (ref 30.0–36.0)
MCHC: 32.7 g/dL (ref 30.0–36.0)
MCV: 82.2 fL (ref 80.0–100.0)
MCV: 83.5 fL (ref 80.0–100.0)
Platelets: 172 10*3/uL (ref 150–400)
Platelets: 169 10*3/uL (ref 150–400)
RBC: 4.54 MIL/uL (ref 3.87–5.11)
RBC: 4.73 MIL/uL (ref 3.87–5.11)
RDW: 15.3 % (ref 11.5–15.5)
RDW: 15.2 % (ref 11.5–15.5)
WBC: 8.5 10*3/uL (ref 4.0–10.5)
WBC: 9 10*3/uL (ref 4.0–10.5)
nRBC: 0 % (ref 0.0–0.2)
nRBC: 0 % (ref 0.0–0.2)

## 2020-12-21 LAB — URINALYSIS, ROUTINE W REFLEX MICROSCOPIC
Bilirubin Urine: NEGATIVE
Glucose, UA: NEGATIVE mg/dL
Hgb urine dipstick: NEGATIVE
Ketones, ur: NEGATIVE mg/dL
Leukocytes,Ua: NEGATIVE
Nitrite: NEGATIVE
Protein, ur: NEGATIVE mg/dL
Specific Gravity, Urine: 1.005 (ref 1.005–1.030)
pH: 6 (ref 5.0–8.0)

## 2020-12-21 LAB — RESP PANEL BY RT-PCR (FLU A&B, COVID) ARPGX2
Influenza A by PCR: NEGATIVE
Influenza B by PCR: NEGATIVE
SARS Coronavirus 2 by RT PCR: NEGATIVE

## 2020-12-21 LAB — RPR: RPR Ser Ql: NONREACTIVE

## 2020-12-21 LAB — TYPE AND SCREEN
ABO/RH(D): O POS
Antibody Screen: NEGATIVE

## 2020-12-21 MED ORDER — IBUPROFEN 600 MG PO TABS
600.0000 mg | ORAL_TABLET | Freq: Four times a day (QID) | ORAL | Status: DC
  Administered 2020-12-21 – 2020-12-22 (×5): 600 mg via ORAL
  Filled 2020-12-21 (×5): qty 1

## 2020-12-21 MED ORDER — ACETAMINOPHEN 325 MG PO TABS: 650.0000 mg | ORAL_TABLET | ORAL | Status: DC | PRN

## 2020-12-21 MED ORDER — SENNOSIDES-DOCUSATE SODIUM 8.6-50 MG PO TABS
2.0000 | ORAL_TABLET | Freq: Every day | ORAL | Status: DC
  Administered 2020-12-22: 2 via ORAL
  Filled 2020-12-21: qty 2

## 2020-12-21 MED ORDER — PHENYLEPHRINE 40 MCG/ML (10ML) SYRINGE FOR IV PUSH (FOR BLOOD PRESSURE SUPPORT): 80.0000 ug | PREFILLED_SYRINGE | INTRAVENOUS | Status: DC | PRN

## 2020-12-21 MED ORDER — TERBUTALINE SULFATE 1 MG/ML IJ SOLN: 0.2500 mg | Freq: Once | INTRAMUSCULAR | Status: DC | PRN

## 2020-12-21 MED ORDER — EPHEDRINE 5 MG/ML INJ: 10.0000 mg | INTRAVENOUS | Status: DC | PRN

## 2020-12-21 MED ORDER — TETANUS-DIPHTH-ACELL PERTUSSIS 5-2.5-18.5 LF-MCG/0.5 IM SUSY: 0.5000 mL | PREFILLED_SYRINGE | Freq: Once | INTRAMUSCULAR | Status: DC

## 2020-12-21 MED ORDER — OXYCODONE-ACETAMINOPHEN 5-325 MG PO TABS: 1.0000 | ORAL_TABLET | ORAL | Status: DC | PRN

## 2020-12-21 MED ORDER — ACETAMINOPHEN 325 MG PO TABS
650.0000 mg | ORAL_TABLET | ORAL | Status: DC | PRN
  Administered 2020-12-21: 650 mg via ORAL
  Filled 2020-12-21: qty 2

## 2020-12-21 MED ORDER — BENZOCAINE-MENTHOL 20-0.5 % EX AERO
1.0000 "application " | INHALATION_SPRAY | CUTANEOUS | Status: DC | PRN
  Administered 2020-12-21: 1 via TOPICAL
  Filled 2020-12-21: qty 56

## 2020-12-21 MED ORDER — LIDOCAINE HCL (PF) 1 % IJ SOLN: 30.0000 mL | INTRAMUSCULAR | Status: DC | PRN

## 2020-12-21 MED ORDER — LIDOCAINE HCL (PF) 1 % IJ SOLN
INTRAMUSCULAR | Status: DC | PRN
  Administered 2020-12-21: 10 mL via EPIDURAL
  Administered 2020-12-21: 2 mL via EPIDURAL

## 2020-12-21 MED ORDER — LACTATED RINGERS IV SOLN: 500.0000 mL | INTRAVENOUS | Status: DC | PRN

## 2020-12-21 MED ORDER — SOD CITRATE-CITRIC ACID 500-334 MG/5ML PO SOLN: 30.0000 mL | ORAL | Status: DC | PRN

## 2020-12-21 MED ORDER — ONDANSETRON HCL 4 MG/2ML IJ SOLN: 4.0000 mg | INTRAMUSCULAR | Status: DC | PRN

## 2020-12-21 MED ORDER — FENTANYL-BUPIVACAINE-NACL 0.5-0.125-0.9 MG/250ML-% EP SOLN: 12.0000 mL/h | EPIDURAL | Status: DC | PRN

## 2020-12-21 MED ORDER — DIBUCAINE (PERIANAL) 1 % EX OINT: 1.0000 "application " | TOPICAL_OINTMENT | CUTANEOUS | Status: DC | PRN

## 2020-12-21 MED ORDER — FENTANYL CITRATE (PF) 100 MCG/2ML IJ SOLN: 100.0000 ug | INTRAMUSCULAR | Status: DC | PRN

## 2020-12-21 MED ORDER — WITCH HAZEL-GLYCERIN EX PADS: 1.0000 "application " | MEDICATED_PAD | CUTANEOUS | Status: DC | PRN

## 2020-12-21 MED ORDER — OXYTOCIN-SODIUM CHLORIDE 30-0.9 UT/500ML-% IV SOLN
1.0000 m[IU]/min | INTRAVENOUS | Status: DC
Start: 2020-12-21 — End: 2020-12-21
  Administered 2020-12-21: 2 m[IU]/min via INTRAVENOUS

## 2020-12-21 MED ORDER — LACTATED RINGERS IV SOLN: 500.0000 mL | Freq: Once | INTRAVENOUS | Status: DC

## 2020-12-21 MED ORDER — OXYTOCIN-SODIUM CHLORIDE 30-0.9 UT/500ML-% IV SOLN
2.5000 [IU]/h | INTRAVENOUS | Status: DC
  Filled 2020-12-21: qty 500

## 2020-12-21 MED ORDER — OXYCODONE-ACETAMINOPHEN 5-325 MG PO TABS: 2.0000 | ORAL_TABLET | ORAL | Status: DC | PRN

## 2020-12-21 MED ORDER — FENTANYL-BUPIVACAINE-NACL 0.5-0.125-0.9 MG/250ML-% EP SOLN
EPIDURAL | Status: DC | PRN
  Administered 2020-12-21: 12 mL/h via EPIDURAL

## 2020-12-21 MED ORDER — FENTANYL-BUPIVACAINE-NACL 0.5-0.125-0.9 MG/250ML-% EP SOLN
EPIDURAL | Status: AC
  Filled 2020-12-21: qty 250

## 2020-12-21 MED ORDER — DIPHENHYDRAMINE HCL 25 MG PO CAPS: 25.0000 mg | ORAL_CAPSULE | Freq: Four times a day (QID) | ORAL | Status: DC | PRN

## 2020-12-21 MED ORDER — COCONUT OIL OIL: 1.0000 "application " | TOPICAL_OIL | Status: DC | PRN

## 2020-12-21 MED ORDER — ONDANSETRON HCL 4 MG PO TABS: 4.0000 mg | ORAL_TABLET | ORAL | Status: DC | PRN

## 2020-12-21 MED ORDER — LACTATED RINGERS IV SOLN: INTRAVENOUS | Status: DC

## 2020-12-21 MED ORDER — ZOLPIDEM TARTRATE 5 MG PO TABS: 5.0000 mg | ORAL_TABLET | Freq: Every evening | ORAL | Status: DC | PRN

## 2020-12-21 MED ORDER — OXYTOCIN BOLUS FROM INFUSION
333.0000 mL | Freq: Once | INTRAVENOUS | Status: AC
  Administered 2020-12-21: 333 mL via INTRAVENOUS

## 2020-12-21 MED ORDER — ONDANSETRON HCL 4 MG/2ML IJ SOLN: 4.0000 mg | Freq: Four times a day (QID) | INTRAMUSCULAR | Status: DC | PRN

## 2020-12-21 MED ORDER — PRENATAL MULTIVITAMIN CH
1.0000 | ORAL_TABLET | Freq: Every day | ORAL | Status: DC
  Administered 2020-12-22: 1 via ORAL
  Filled 2020-12-21: qty 1

## 2020-12-21 MED ORDER — SIMETHICONE 80 MG PO CHEW: 80.0000 mg | CHEWABLE_TABLET | ORAL | Status: DC | PRN

## 2020-12-21 MED ORDER — DIPHENHYDRAMINE HCL 50 MG/ML IJ SOLN: 12.5000 mg | INTRAMUSCULAR | Status: DC | PRN

## 2020-12-21 NOTE — Progress Notes (Signed)
Labor Progress Note Elizabeth Odonnell is a 35 y.o. G3P2002 at [redacted]w[redacted]d presented for early labor S:   O:  BP 113/73 (BP Location: Right Arm)   Pulse 98   Temp 98.1 F (36.7 C) (Oral)   Resp 18   Ht 5\' 2"  (1.575 m)   Wt 70.1 kg   LMP 03/21/2020 (Exact Date)   SpO2 100%   BMI 28.28 kg/m   Fetal Tracing:  Baseline: 140 Variability: moderate Accels: 15x15 Decels: none  Toco: 5-10   CVE: Dilation: 2 (unchanged) Effacement (%): 50 Station: Ballotable Presentation: Vertex Exam by:: [email or web address], CNM   A&P: 35 y.o. 32 [redacted]w[redacted]d early labor #Labor: Outer os 4cm but inner os still 2cm. Discussed with patient pitocin augmentation. Patient agreeable to plan of care. Will start pit 2x2 and AROM when able #Pain: epidural #FWB: Cat 1 #GBS negative  [redacted]w[redacted]d, CNM 9:09 AM

## 2020-12-21 NOTE — Progress Notes (Signed)
Labor Progress Note Elizabeth Odonnell is a 35 y.o. G3P2002 at [redacted]w[redacted]d presented for early labor  S:  Patient comfortable with epidural  O:  BP 117/62   Pulse 99   Temp 99.1 F (37.3 C) (Axillary)   Resp 16   Ht 5\' 2"  (1.575 m)   Wt 70.1 kg   LMP 03/21/2020 (Exact Date)   SpO2 100%   BMI 28.28 kg/m   Fetal Tracing:  Baseline: 140 Variability: moderate Accels: 15x15 Decels: none  Toco: 2-4   CVE: Dilation: 4.5 Effacement (%): 70 Station: -3 Presentation: Vertex Exam by:: neil cnm   A&P: 35 y.o. 32 [redacted]w[redacted]d Early labor #Labor: Progressing well. Discussed with patient risks and benefits of AROM for augmentation of labor. Patient agreeable to plan of care. AROM with large amount of clear fluid. Patient and FHR tolerated procedure well. Will continue to titrate pitocin #Pain: epidural #FWB: Cat 1 #GBS negative  [redacted]w[redacted]d, CNM 12:10 PM

## 2020-12-21 NOTE — Anesthesia Preprocedure Evaluation (Signed)
Anesthesia Evaluation  Patient identified by MRN, date of birth, ID band Patient awake    Reviewed: Allergy & Precautions, Patient's Chart, lab work & pertinent test results  Airway Mallampati: II  TM Distance: >3 FB Neck ROM: Full    Dental no notable dental hx.    Pulmonary neg pulmonary ROS,    Pulmonary exam normal breath sounds clear to auscultation       Cardiovascular negative cardio ROS Normal cardiovascular exam Rhythm:Regular Rate:Normal     Neuro/Psych negative neurological ROS  negative psych ROS   GI/Hepatic negative GI ROS, Neg liver ROS,   Endo/Other  negative endocrine ROS  Renal/GU negative Renal ROS  negative genitourinary   Musculoskeletal negative musculoskeletal ROS (+)   Abdominal   Peds negative pediatric ROS (+)  Hematology negative hematology ROS (+) hct 39.5, plt 169   Anesthesia Other Findings   Reproductive/Obstetrics (+) Pregnancy                             Anesthesia Physical Anesthesia Plan  ASA: 2  Anesthesia Plan: Epidural   Post-op Pain Management:    Induction:   PONV Risk Score and Plan: 2  Airway Management Planned: Natural Airway  Additional Equipment: None  Intra-op Plan:   Post-operative Plan:   Informed Consent: I have reviewed the patients History and Physical, chart, labs and discussed the procedure including the risks, benefits and alternatives for the proposed anesthesia with the patient or authorized representative who has indicated his/her understanding and acceptance.       Plan Discussed with:   Anesthesia Plan Comments:         Anesthesia Quick Evaluation

## 2020-12-21 NOTE — Anesthesia Procedure Notes (Signed)
Epidural Patient location during procedure: OB Start time: 12/21/2020 4:53 AM End time: 12/21/2020 5:03 AM  Staffing Anesthesiologist: Lannie Fields, DO Performed: anesthesiologist   Preanesthetic Checklist Completed: patient identified, IV checked, risks and benefits discussed, monitors and equipment checked, pre-op evaluation and timeout performed  Epidural Patient position: sitting Prep: DuraPrep and site prepped and draped Patient monitoring: continuous pulse ox, blood pressure, heart rate and cardiac monitor Approach: midline Location: L3-L4 Injection technique: LOR air  Needle:  Needle type: Tuohy  Needle gauge: 17 G Needle length: 9 cm Needle insertion depth: 5 cm Catheter type: closed end flexible Catheter size: 19 Gauge Catheter at skin depth: 10 cm Test dose: negative  Assessment Sensory level: T8 Events: blood not aspirated, injection not painful, no injection resistance, no paresthesia and negative IV test  Additional Notes Patient identified. Risks/Benefits/Options discussed with patient including but not limited to bleeding, infection, nerve damage, paralysis, failed block, incomplete pain control, headache, blood pressure changes, nausea, vomiting, reactions to medication both or allergic, itching and postpartum back pain. Confirmed with bedside nurse the patient's most recent platelet count. Confirmed with patient that they are not currently taking any anticoagulation, have any bleeding history or any family history of bleeding disorders. Patient expressed understanding and wished to proceed. All questions were answered. Sterile technique was used throughout the entire procedure. Please see nursing notes for vital signs. Test dose was given through epidural catheter and negative prior to continuing to dose epidural or start infusion. Warning signs of high block given to the patient including shortness of breath, tingling/numbness in hands, complete motor  block, or any concerning symptoms with instructions to call for help. Patient was given instructions on fall risk and not to get out of bed. All questions and concerns addressed with instructions to call with any issues or inadequate analgesia.  Reason for block:procedure for pain

## 2020-12-21 NOTE — Lactation Note (Signed)
This note was copied from a baby's chart. Lactation Consultation Note  Patient Name: Elizabeth Odonnell AJOIN'O Date: 12/21/2020 Reason for consult: L&D Initial assessment;Mother's request;Term Age:34 hours LC contacted RN to see if Mom wanted assistance latching since not listed on the board. RN stated yes, LC arrived to assist Mom latching infant in cross cradle on the left breast. Signs of milk transfer noted and pain with latch resolved with chin tug and ensuring cheeks and nose touching the breast.  Mom to receive further LC support on the floor.   Maternal Data Has patient been taught Hand Expression?: Yes Does the patient have breastfeeding experience prior to this delivery?: Yes How long did the patient breastfeed?: 14 months and 18 months  Feeding Mother's Current Feeding Choice: Breast Milk  LATCH Score Latch: Repeated attempts needed to sustain latch, nipple held in mouth throughout feeding, stimulation needed to elicit sucking reflex.  Audible Swallowing: Spontaneous and intermittent  Type of Nipple: Everted at rest and after stimulation  Comfort (Breast/Nipple): Filling, red/small blisters or bruises, mild/mod discomfort (LC able to diminish discomfort with chin tug and keeping cheeks and nose touching the breast. Mom stated with changes pain went away able to nurse comfortably.)  Hold (Positioning): Assistance needed to correctly position infant at breast and maintain latch.  LATCH Score: 7   Lactation Tools Discussed/Used    Interventions Interventions: Breast feeding basics reviewed;Breast compression;Assisted with latch;Adjust position;Skin to skin;Hand express;Expressed milk;Education  Discharge    Consult Status Consult Status: Follow-up Date: 12/22/20 Follow-up type: In-patient    Elizabeth Canniff  Odonnell 12/21/2020, 3:50 PM

## 2020-12-21 NOTE — MAU Note (Addendum)
PT SAYS DIZZY- SINCE Friday.  VOMITING STARTED YESTERDAY -DID NOT CALL DR BACK PAIN- YESTERDAY - TOOK TYLENOL TODAY AT NOON PNC WITH CLINIC  1 CM - 2 WEEKS AGO  GBS- NEG   FEELS UC'- NOT REG.

## 2020-12-21 NOTE — Discharge Summary (Signed)
   Postpartum Discharge Summary     Patient Name: Elizabeth Odonnell DOB: 06/04/1986 MRN: [id number]  Date of admission: 12/21/2020 Delivery date:12/21/2020  Delivering provider: NEILL, CAROLINE M  Date of discharge: 12/22/2020  Admitting diagnosis: Supervision of low-risk pregnancy, third trimester [Z34.93] Intrauterine pregnancy: [redacted]w[redacted]d     Secondary diagnosis:  Active Problems:   Supervision of low-risk pregnancy, third trimester  Additional problems: n/a    Discharge diagnosis: Term Pregnancy Delivered                                              Post partum procedures: Nexplanon Augmentation: AROM and Pitocin Complications: None  Hospital course: Onset of Labor With Vaginal Delivery      35 y.o. yo G3P2002 at [redacted]w[redacted]d was admitted in Latent Labor on 12/21/2020. Patient had an uncomplicated labor course as follows: started pitocin and AROM with quick progression to complete Membrane Rupture Time/Date: 11:59 AM ,12/21/2020   Delivery Method:Vaginal, Spontaneous  Episiotomy: None  Lacerations:  2nd degree  Patient had an uncomplicated postpartum course.  She is ambulating, tolerating a regular diet, passing flatus, and urinating well. Patient is discharged home in stable condition on 12/22/20.  Newborn Data: Birth date:12/21/2020  Birth time:3:04 PM  Gender:Female  Living status:Living  Apgars:9 ,9  Weight:3640 g   Magnesium Sulfate received: No BMZ received: No Rhophylac:N/A MMR:N/A T-DaP:Given prenatally Flu: Yes Transfusion:No  Physical exam  Vitals:   12/21/20 1800 12/21/20 2243 12/22/20 0212 12/22/20 0542  BP: 112/75 111/70 92/63 99/67  Pulse: 89 73 80 86  Resp: 17 18 18 18  Temp: 98.2 F (36.8 C) 98.5 F (36.9 C) 98.1 F (36.7 C) 97.9 F (36.6 C)  TempSrc: Oral Oral Oral Oral  SpO2: 100% 100% 100% 100%  Weight:      Height:       General: alert, cooperative, and no distress Lochia: appropriate Uterine Fundus: firm Incision: N/A DVT Evaluation:  No evidence of DVT seen on physical exam. No cords or calf tenderness. No significant calf/ankle edema. Labs: Lab Results  Component Value Date   WBC 9.0 12/21/2020   HGB 12.9 12/21/2020   HCT 39.5 12/21/2020   MCV 83.5 12/21/2020   PLT 169 12/21/2020   CMP Latest Ref Rng & Units 03/04/2019  Glucose 70 - 99 mg/dL 94  BUN 6 - 20 mg/dL 8  Creatinine 0.44 - 1.00 mg/dL 0.49  Sodium 135 - 145 mmol/L 136  Potassium 3.5 - 5.1 mmol/L 3.9  Chloride 98 - 111 mmol/L 102  CO2 22 - 32 mmol/L 25  Calcium 8.9 - 10.3 mg/dL 9.7  Total Protein 6.5 - 8.1 g/dL 7.9  Total Bilirubin 0.3 - 1.2 mg/dL 0.3  Alkaline Phos 38 - 126 U/L 99  AST 15 - 41 U/L 22  ALT 0 - 44 U/L 12   Edinburgh Score: Edinburgh Postnatal Depression Scale Screening Tool 03/14/2018  I have been able to laugh and see the funny side of things. 0  I have looked forward with enjoyment to things. 0  I have blamed myself unnecessarily when things went wrong. 0  I have been anxious or worried for no good reason. 0  I have felt scared or panicky for no good reason. 0  Things have been getting on top of me. 2  I have been so unhappy that I have had difficulty   sleeping. 0  I have felt sad or miserable. 0  I have been so unhappy that I have been crying. 0  The thought of harming myself has occurred to me. 0  Edinburgh Postnatal Depression Scale Total 2     After visit meds:  Allergies as of 12/22/2020       Reactions   Other Itching   Eggplant--itching in and around mouth        Medication List     STOP taking these medications    cyclobenzaprine 5 MG tablet Commonly known as: FLEXERIL   ferrous sulfate 325 (65 FE) MG tablet       TAKE these medications    acetaminophen 325 MG tablet Commonly known as: Tylenol Take 2 tablets (650 mg total) by mouth every 6 (six) hours as needed for mild pain, moderate pain, fever or headache (for pain scale < 4).   coconut oil Oil Apply 1 application topically as needed  (nipple pain).   ibuprofen 600 MG tablet Commonly known as: ADVIL Take 1 tablet (600 mg total) by mouth every 6 (six) hours.   prenatal vitamin w/FE, FA 27-1 MG Tabs tablet Take 1 tablet by mouth daily at 12 noon.         Discharge home in stable condition Infant Feeding: Breast Infant Disposition:home with mother Discharge instruction: per After Visit Summary and Postpartum booklet. Activity: Advance as tolerated. Pelvic rest for 6 weeks.  Diet: routine diet Future Appointments: Future Appointments  Date Time Provider [place]  12/22/2020  2:15 PM Gabriel Carina, CNM [facility] [place]   Follow up Visit:  Please schedule this patient for a In person postpartum visit in 4 weeks with the following provider: Any provider. Additional Postpartum F/U: n/a   Low risk pregnancy complicated by:  n/a Delivery mode:  Vaginal, Spontaneous  Anticipated Birth Control:  PP Nexplanon placed  Negin Hegg, Gildardo Cranker, MD OB Fellow, [facility] 12/22/2020 6:23 AM

## 2020-12-21 NOTE — H&P (Signed)
Elizabeth Odonnell is a 35 y.o. female [phone] with IUP at [redacted]w[redacted]d by LMP presenting for contractions..  She reports positive fetal movement. She denies leakage of fluid or vaginal bleeding.  Prenatal History/Complications: PNC at [facility] Pregnancy complications:  - Past Medical History: Past Medical History:  Diagnosis Date   Medical history non-contributory    Pelvic pain     Past Surgical History: Past Surgical History:  Procedure Laterality Date   NO PAST SURGERIES      Obstetrical History: OB History     Gravida  3   Para  2   Term  2   Preterm  0   AB  0   Living  2      SAB  0   IAB  0   Ectopic  0   Multiple      Live Births  2            Social History: Social History   Socioeconomic History   Marital status: Married    Spouse name: Not on file   Number of children: Not on file   Years of education: Not on file   Highest education level: Not on file  Occupational History   Not on file  Tobacco Use   Smoking status: Never   Smokeless tobacco: Never  Vaping Use   Vaping Use: Never used  Substance and Sexual Activity   Alcohol use: No   Drug use: No   Sexual activity: Not Currently    Birth control/protection: None  Other Topics Concern   Not on file  Social History Narrative   ** Merged History Encounter **       Social Determinants of Health   Financial Resource Strain: Not on file  Food Insecurity: No Food Insecurity   Worried About Programme researcher, broadcasting/film/video in the Last Year: Never true   Ran Out of Food in the Last Year: Never true  Transportation Needs: No Transportation Needs   Lack of Transportation (Medical): No   Lack of Transportation (Non-Medical): No  Physical Activity: Not on file  Stress: Not on file  Social Connections: Not on file    Family History: Family History  Problem Relation Age of Onset   Diabetes Mother     Allergies: Allergies  Allergen Reactions   Other Itching    Eggplant--itching  in and around mouth    Medications Prior to Admission  Medication Sig Dispense Refill Last Dose   ferrous sulfate 325 (65 FE) MG tablet Take 1 tablet (325 mg total) by mouth every other day. 15 tablet 11 Past Week   prenatal vitamin w/FE, FA (PRENATAL 1 + 1) 27-1 MG TABS tablet Take 1 tablet by mouth daily at 12 noon. 30 tablet 11 12/21/2020   cyclobenzaprine (FLEXERIL) 5 MG tablet Take 1 tablet (5 mg total) by mouth at bedtime. (Patient not taking: No sig reported) 10 tablet 0     Review of Systems   Constitutional: Negative for fever and chills Eyes: Negative for visual disturbances Respiratory: Negative for shortness of breath, dyspnea Cardiovascular: Negative for chest pain or palpitations  Gastrointestinal: Negative for vomiting, diarrhea and constipation.  POSITIVE for abdominal pain (contractions) Genitourinary: Negative for dysuria and urgency Musculoskeletal: Negative for back pain, joint pain, myalgias  Neurological: Negative for dizziness and headaches  Blood pressure 116/87, pulse 90, temperature 98.2 F (36.8 C), temperature source Oral, resp. rate 18, height 5\' 2"  (1.575 m), weight 70.1 kg, last menstrual  period 03/21/2020, SpO2 100 %, unknown if currently breastfeeding. General appearance: alert, cooperative, and no distress Lungs: normal respiratory effort Heart: regular rate and rhythm Abdomen: soft, non-tender; bowel sounds normal Extremities: Homans sign is negative, no sign of DVT DTR's 2+ Presentation: cephalic Fetal monitoring  Baseline: 130 bpm, Variability: Good {> 6 bpm), Accelerations: Reactive, and Decelerations: Absent Uterine activity  q 2-6 minutes, painful per pt Dilation: 4 Effacement (%): 80 Station: -1 Exam by:: Shirline Frees, MD   Prenatal labs: ABO, Rh: O/Positive/-- (01/24 1535) Antibody: Negative (01/24 1535) Rubella: 10.10 (01/24 1535) RPR: Non Reactive (04/29 0839)  HBsAg: Negative (01/24 1535)  HIV: Non Reactive (04/29 0839)  GBS:  Negative/-- (06/21 1617)  2 hr Glucola 75/161/123 Genetic screening  neg NIPS Anatomy US normal girl  Prenatal Transfer Tool  Maternal Diabetes: No Genetic Screening: Normal Maternal Ultrasounds/Referrals: Normal Fetal Ultrasounds or other Referrals:  None Maternal Substance Abuse:  No Significant Maternal Medications:  None Significant Maternal Lab Results: Group B Strep negative  Results for orders placed or performed during the hospital encounter of 12/21/20 (from the past 24 hour(s))  Urinalysis, Routine w reflex microscopic Urine, Clean Catch   Collection Time: 12/21/20 12:49 AM  Result Value Ref Range   Color, Urine STRAW (A) YELLOW   APPearance CLEAR CLEAR   Specific Gravity, Urine 1.005 1.005 - 1.030   pH 6.0 5.0 - 8.0   Glucose, UA NEGATIVE NEGATIVE mg/dL   Hgb urine dipstick NEGATIVE NEGATIVE   Bilirubin Urine NEGATIVE NEGATIVE   Ketones, ur NEGATIVE NEGATIVE mg/dL   Protein, ur NEGATIVE NEGATIVE mg/dL   Nitrite NEGATIVE NEGATIVE   Leukocytes,Ua NEGATIVE NEGATIVE  CBC   Collection Time: 12/21/20  1:53 AM  Result Value Ref Range   WBC 8.5 4.0 - 10.5 K/uL   RBC 4.54 3.87 - 5.11 MIL/uL   Hemoglobin 12.5 12.0 - 15.0 g/dL   HCT 99.8 33.8 - 25.0 %   MCV 82.2 80.0 - 100.0 fL   MCH 27.5 26.0 - 34.0 pg   MCHC 33.5 30.0 - 36.0 g/dL   RDW 53.9 76.7 - 34.1 %   Platelets 172 150 - 400 K/uL   nRBC 0.0 0.0 - 0.2 %    Assessment: Elizabeth Odonnell is a 35 y.o. [id number] with an IUP at [redacted]w[redacted]d presenting for early labor.  Cx changed from 2cm>4cms in 1 hour  Plan: #Labor: expectant management #Pain:  Per request #FWB Cat 1 #ID: GBS: neg  #MOF:  breast #MOC: inpt Nexplanon  Jacklyn Shell 12/21/2020, 3:23 AM

## 2020-12-21 NOTE — Lactation Note (Addendum)
This note was copied from a baby's chart. Lactation Consultation Note  Patient Name: Elizabeth Odonnell [id number] Date: 12/21/2020 Reason for consult: L&D Initial assessment;Mother's request;Term Age: 34 hrs LC reviewed feeding cues, keeping infant STS, doing breast massage and hand expression before latching.   Mom experienced with breastfeeding with her 2 other children. Mom does not have a pump at home and requested a manual. Set up, parts, cleaning reviewed.   Mom denied any pain with the latch and had 2 feedings since on the floor. Last feeding 2 hrs ago, infant resting comfortably so LC not able to see a latch at this time.   Plan 1. To feed based on cues 8-12x in 24 hr period no more than 4 hrs without an attempt. Mom to offer both breasts and look for signs of milk transfer.  2. If unable to latch, Mom to do hand expression and offer drops of colostrum via spoon.  3. I and O sheet reviewed.  4 LC brochure of inpatient and outpatient services reviewed.  All questions answered at the end of the visit.   Maternal Data Has patient been taught Hand Expression?: Yes Does the patient have breastfeeding experience prior to this delivery?: Yes How long did the patient breastfeed?: 14 months and 18 months  Feeding Mother's Current Feeding Choice: Breast Milk  LATCH Score Latch: Repeated attempts needed to sustain latch, nipple held in mouth throughout feeding, stimulation needed to elicit sucking reflex.  Audible Swallowing: Spontaneous and intermittent  Type of Nipple: Everted at rest and after stimulation  Comfort (Breast/Nipple): Filling, red/small blisters or bruises, mild/mod discomfort (LC able to diminish discomfort with chin tug and keeping cheeks and nose touching the breast. Mom stated with changes pain went away able to nurse comfortably.)  Hold (Positioning): Assistance needed to correctly position infant at breast and maintain latch.  LATCH Score: 7   Lactation Tools  Discussed/Used    Interventions Interventions: Breast feeding basics reviewed;Breast compression;Assisted with latch;Adjust position;Skin to skin;Hand express;Expressed milk;Education  Discharge    Consult Status Consult Status: Follow-up Date: 12/22/20 Follow-up type: In-patient    Xerxes Agrusa  Nicholson-Springer 12/21/2020, 7:40 PM

## 2020-12-21 NOTE — Progress Notes (Signed)
Patient Vitals for the past 4 hrs:  BP Temp Pulse Resp SpO2  12/21/20 0520 109/67 -- 94 16 --  12/21/20 0515 115/73 -- 91 17 --  12/21/20 0511 110/74 -- 89 -- --  12/21/20 0505 (!) 125/107 -- -- -- --  12/21/20 0500 (!) 112/91 98.1 F (36.7 C) -- 16 --  12/21/20 0424 115/77 -- 88 -- --  12/21/20 0317 116/87 -- 90 18 100 %   Comfortable with epidural.  Ctx spaced out, q 5-10 minutes.  FHR 140;s , cat 1.  Cx 4/50/-2.  Pt amenable to pitocin augmentation if pattern doesn't improve and cx doesn't change.

## 2020-12-21 NOTE — MAU Provider Note (Signed)
Chief Complaint:  Back Pain   None    HPI: Doloros Strehle Jori Odonnell is a 35 y.o. G3P2002 at [redacted]w[redacted]d by L/20 who presents to maternity admissions reporting concern of dizziness for the past 4 days. Pt also reports back pain and emesis since yesterday. Pt took tylenol at home prior to arrival. Reports intermittent uterine contractions.  She reports good fetal movement, denies LOF, vaginal bleeding, vaginal itching/burning, urinary symptoms, h/a, n/v, or fever/chills.   Past Medical History: Past Medical History:  Diagnosis Date   Medical history non-contributory    Pelvic pain     Past obstetric history: OB History  Gravida Para Term Preterm AB Living  3 2 2  0 0 2  SAB IAB Ectopic Multiple Live Births  0 0 0   2    # Outcome Date GA Lbr Len/2nd Weight Sex Delivery Anes PTL Lv  3 Current           2 Term 03/12/18 [redacted]w[redacted]d 03:42 / 00:27 3824 g M Vag-Spont EPI  LIV     Birth Comments: IOL Postdates  1 Term 02/23/15 [redacted]w[redacted]d  3000 g M Vag-Spont Local N LIV     Birth Comments: postdates    Past Surgical History: Past Surgical History:  Procedure Laterality Date   NO PAST SURGERIES      Family History: Family History  Problem Relation Age of Onset   Diabetes Mother     Social History: Social History   Tobacco Use   Smoking status: Never   Smokeless tobacco: Never  Vaping Use   Vaping Use: Never used  Substance Use Topics   Alcohol use: No   Drug use: No    Allergies:  Allergies  Allergen Reactions   Other Itching    Eggplant--itching in and around mouth    Meds:  Medications Prior to Admission  Medication Sig Dispense Refill Last Dose   ferrous sulfate 325 (65 FE) MG tablet Take 1 tablet (325 mg total) by mouth every other day. 15 tablet 11 Past Week   prenatal vitamin w/FE, FA (PRENATAL 1 + 1) 27-1 MG TABS tablet Take 1 tablet by mouth daily at 12 noon. 30 tablet 11 12/21/2020   cyclobenzaprine (FLEXERIL) 5 MG tablet Take 1 tablet (5 mg total) by mouth at bedtime.  (Patient not taking: No sig reported) 10 tablet 0     ROS:  Review of Systems  Constitutional:  Negative for chills and fever.  HENT:  Negative for congestion and sore throat.   Eyes:  Negative for photophobia and visual disturbance.  Respiratory:  Negative for cough, chest tightness and shortness of breath.   Cardiovascular:  Negative for chest pain.  Gastrointestinal:  Positive for abdominal pain. Negative for constipation, diarrhea, nausea and vomiting.  Genitourinary:  Negative for dysuria, vaginal bleeding, vaginal discharge and vaginal pain.  Neurological:  Positive for dizziness. Negative for headaches.    I have reviewed patient's Past Medical Hx, Surgical Hx, Family Hx, Social Hx, medications and allergies.   Physical Exam  Patient Vitals for the past 24 hrs:  BP Temp Temp src Pulse Resp SpO2 Height Weight  12/21/20 0520 109/67 -- -- 94 16 -- -- --  12/21/20 0515 115/73 -- -- 91 17 -- -- --  12/21/20 0511 110/74 -- -- 89 -- -- -- --  12/21/20 0505 (!) 125/107 -- -- -- -- -- -- --  12/21/20 0500 (!) 112/91 98.1 F (36.7 C) -- -- 16 -- -- --  12/21/20 0424  115/77 -- -- 88 -- -- -- --  12/21/20 0317 116/87 -- -- 90 18 100 % -- --  12/21/20 0122 115/73 98.2 F (36.8 C) Oral 99 18 100 % -- --  12/21/20 0042 120/73 98.1 F (36.7 C) Oral 93 18 -- 5\' 2"  (1.575 m) 70.1 kg   Constitutional: Well-developed, well-nourished female in no acute distress.  Cardiovascular: normal rate Respiratory: normal effort GI: Abd soft, non-tender, gravid appropriate for gestational age.  MS: Extremities nontender, no edema, normal ROM Neurologic: Alert and oriented x 4.  Bimanual exam: Cervix 2/50/-1 >4/70/-1 s/p 1hr of observation  FHT:  Baseline 130, moderate variability, accelerations present, no decelerations Contractions: every 3-4 min   Labs: Results for orders placed or performed during the hospital encounter of 12/21/20 (from the past 24 hour(s))  Urinalysis, Routine w reflex  microscopic Urine, Clean Catch     Status: Abnormal   Collection Time: 12/21/20 12:49 AM  Result Value Ref Range   Color, Urine STRAW (A) YELLOW   APPearance CLEAR CLEAR   Specific Gravity, Urine 1.005 1.005 - 1.030   pH 6.0 5.0 - 8.0   Glucose, UA NEGATIVE NEGATIVE mg/dL   Hgb urine dipstick NEGATIVE NEGATIVE   Bilirubin Urine NEGATIVE NEGATIVE   Ketones, ur NEGATIVE NEGATIVE mg/dL   Protein, ur NEGATIVE NEGATIVE mg/dL   Nitrite NEGATIVE NEGATIVE   Leukocytes,Ua NEGATIVE NEGATIVE  CBC     Status: None   Collection Time: 12/21/20  1:53 AM  Result Value Ref Range   WBC 8.5 4.0 - 10.5 K/uL   RBC 4.54 3.87 - 5.11 MIL/uL   Hemoglobin 12.5 12.0 - 15.0 g/dL   HCT 02/21/21 35.5 - 97.4 %   MCV 82.2 80.0 - 100.0 fL   MCH 27.5 26.0 - 34.0 pg   MCHC 33.5 30.0 - 36.0 g/dL   RDW 16.3 84.5 - 36.4 %   Platelets 172 150 - 400 K/uL   nRBC 0.0 0.0 - 0.2 %  Resp Panel by RT-PCR (Flu A&B, Covid) Nasopharyngeal Swab     Status: None   Collection Time: 12/21/20  3:57 AM   Specimen: Nasopharyngeal Swab; Nasopharyngeal(NP) swabs in vial transport medium  Result Value Ref Range   SARS Coronavirus 2 by RT PCR NEGATIVE NEGATIVE   Influenza A by PCR NEGATIVE NEGATIVE   Influenza B by PCR NEGATIVE NEGATIVE  Type and screen  [facility]     Status: None   Collection Time: 12/21/20  4:00 AM  Result Value Ref Range   ABO/RH(D) O POS    Antibody Screen NEG    Sample Expiration      12/24/2020,2359 Performed at [facility], [address], [place], [place]   CBC     Status: None   Collection Time: 12/21/20  4:20 AM  Result Value Ref Range   WBC 9.0 4.0 - 10.5 K/uL   RBC 4.73 3.87 - 5.11 MIL/uL   Hemoglobin 12.9 12.0 - 15.0 g/dL   HCT 02/21/21 48.2 - 50.0 %   MCV 83.5 80.0 - 100.0 fL   MCH 27.3 26.0 - 34.0 pg   MCHC 32.7 30.0 - 36.0 g/dL   RDW 37.0 48.8 - 89.1 %   Platelets 169 150 - 400 K/uL   nRBC 0.0 0.0 - 0.2 %   --/--/O POS (07/12 0400)  Imaging:  No results  found.  MAU Course/MDM: Orders Placed This Encounter  Procedures   Resp Panel by RT-PCR (Flu A&B, Covid)  Nasopharyngeal Swab   Urinalysis, Routine w reflex microscopic Urine, Clean Catch   CBC   CBC   RPR   Diet laboring Room service appropriate? Yes   Vitals signs per unit policy   Notify Physician   Fetal monitoring per unit policy   Activity as tolerated   Cervical Exam   Measure blood pressure post delivery every 15 min x 1 hour then every 30 min x 1 hour   Fundal check post delivery every 15 min x 1 hour then every 30 min x 1 hour   If Rapid HIV test positive or known HIV positive: initiate AZT orders   May in and out cath x 2 for inability to void   Discontinue foley prior to vaginal delivery   Initiate Carrier Fluid Protocol   Initiate Oral Care Protocol   Order Rapid HIV per protocol if no results on chart   Patient may have epidural   May use local infiltration of 1% lidocaine plain to produce a skin wheal prior to IV insertion   Notify in-house Anesthesia team of nausea and vomiting greater than 5 hours   Assess for signs/symptoms of PIH/preeclampsia   RN to place order for: CBC if one has not been drawn in the past 6 hours for all patients with hypertensive disease, pre-eclampsia, eclampsia, thrombocytopenia or previous PLTC<150,000.   Identify to Anesthesia if patient plans to have postpartum tubal ligation; do not remove epidural without discussion with Anesthesiologist   Vital signs following Epidural Placement, re-bolus or re-dose monitor patient's BP and oxygen saturation every 5 minutes for 30 minutes   RN to remain at bedside continuously for 30 minutes post epidural placement, post re-bolus / re-dose   Notify Anesthesia if the patient becomes short of breath or complains of heaviness in chest, chest pain, and/or unrelieved pain   Notify Anesthesia prior to discontinuing epidural infusion   Discontinue epidural infusion at conclusion of delivery and/or repair  unless otherwise indicated by provider   Full code   Airborne and Contact precautions   Type and screen [facility]   Insert and maintain IV Line   Admit to Inpatient (patient's expected length of stay will be greater than 2 midnights or inpatient only procedure)    Meds ordered this encounter  Medications   lactated ringers infusion   oxytocin (PITOCIN) IV BOLUS FROM BAG   oxytocin (PITOCIN) IV infusion 30 units in NS 500 mL - Premix   lactated ringers infusion 500-1,000 mL   acetaminophen (TYLENOL) tablet 650 mg   oxyCODONE-acetaminophen (PERCOCET/ROXICET) 5-325 MG per tablet 1 tablet   oxyCODONE-acetaminophen (PERCOCET/ROXICET) 5-325 MG per tablet 2 tablet   ondansetron (ZOFRAN) injection 4 mg   sodium citrate-citric acid (ORACIT) solution 30 mL   lidocaine (PF) (XYLOCAINE) 1 % injection 30 mL   fentaNYL (SUBLIMAZE) injection 100 mcg   ePHEDrine injection 10 mg   PHENYLephrine 40 mcg/ml in normal saline Adult IV Push Syringe (For Blood Pressure Support)   lactated ringers infusion 500 mL   fentaNYL 2 mcg/mL w/ bupivacaine 0.125% in NS 250 mL epidural infusion ([facility]AN[facility])   diphenhydrAMINE (BENADRYL) injection 12.5 mg   ePHEDrine injection 10 mg   PHENYLephrine 40 mcg/ml in normal saline Adult IV Push Syringe (For Blood Pressure Support)   fentaNYL 2 mcg/mL w/bupivacaine 0.125% in NS 250 mL 0.5-0.125-0.9 MG/250ML-%    Welford Roche   : cabinet override    NST reviewed and reactive. CBC ordered given report of dizziness. Treatments in  MAU included tylenol.  CBC and UA unremarkable. Pt reports increasing frequency of contractions.  Assessment & Plan: Elizabeth Odonnell is a 35 y.o. G3P2002 at 6515w2d by L/20 who presents to maternity admissions reporting concern of dizziness, back pain and worsening contractions for 3 days. 1. Encounter for supervision of low-risk pregnancy in third trimester   Given observed cervical change from 2cm to 4cm s/p 1 hour  of regular, painful contractions, called [facility] for admission to L&D.  Allergies as of 12/21/2020       Reactions   Other Itching   Eggplant--itching in and around mouth   Katlin Bortner, Skipper ClicheAnna E, MD OB Fellow, [facility] 12/21/2020 6:07 AM

## 2020-12-22 ENCOUNTER — Encounter: Payer: Medicaid Other | Admitting: Certified Nurse Midwife

## 2020-12-22 DIAGNOSIS — Z30017 Encounter for initial prescription of implantable subdermal contraceptive: Secondary | ICD-10-CM

## 2020-12-22 LAB — CBC
HCT: 32.3 % — ABNORMAL LOW (ref 36.0–46.0)
Hemoglobin: 10.7 g/dL — ABNORMAL LOW (ref 12.0–15.0)
MCH: 27.7 pg (ref 26.0–34.0)
MCHC: 33.1 g/dL (ref 30.0–36.0)
MCV: 83.7 fL (ref 80.0–100.0)
Platelets: 163 10*3/uL (ref 150–400)
RBC: 3.86 MIL/uL — ABNORMAL LOW (ref 3.87–5.11)
RDW: 15.7 % — ABNORMAL HIGH (ref 11.5–15.5)
WBC: 7.9 10*3/uL (ref 4.0–10.5)
nRBC: 0 % (ref 0.0–0.2)

## 2020-12-22 MED ORDER — COCONUT OIL OIL: 1.0000 "application " | TOPICAL_OIL | 0 refills | Status: DC | PRN

## 2020-12-22 MED ORDER — ETONOGESTREL 68 MG ~~LOC~~ IMPL
68.0000 mg | DRUG_IMPLANT | Freq: Once | SUBCUTANEOUS | Status: AC
  Administered 2020-12-22: 68 mg via SUBCUTANEOUS
  Filled 2020-12-22: qty 1

## 2020-12-22 MED ORDER — ACETAMINOPHEN 325 MG PO TABS: 650.0000 mg | ORAL_TABLET | Freq: Four times a day (QID) | ORAL | Status: DC | PRN

## 2020-12-22 MED ORDER — LIDOCAINE HCL 1 % IJ SOLN
0.0000 mL | Freq: Once | INTRAMUSCULAR | Status: AC | PRN
  Administered 2020-12-22: 20 mL via INTRADERMAL
  Filled 2020-12-22: qty 20

## 2020-12-22 MED ORDER — IBUPROFEN 600 MG PO TABS: 600.0000 mg | ORAL_TABLET | Freq: Four times a day (QID) | ORAL | 0 refills | Status: DC

## 2020-12-22 NOTE — Anesthesia Postprocedure Evaluation (Signed)
Anesthesia Post Note  Patient: Elizabeth Odonnell  Procedure(s) Performed: AN AD HOC LABOR EPIDURAL     Patient location during evaluation: Mother Baby Anesthesia Type: Epidural Level of consciousness: awake, oriented and awake and alert Pain management: pain level controlled Vital Signs Assessment: post-procedure vital signs reviewed and stable Respiratory status: spontaneous breathing, nonlabored ventilation and respiratory function stable Cardiovascular status: stable Postop Assessment: no headache, adequate PO intake, able to ambulate, patient able to bend at knees, no backache and no apparent nausea or vomiting Anesthetic complications: no   No notable events documented.  Last Vitals:  Vitals:   12/22/20 0212 12/22/20 0542  BP: 92/63 99/67  Pulse: 80 86  Resp: 18 18  Temp: 36.7 C 36.6 C  SpO2: 100% 100%    Last Pain:  Vitals:   12/22/20 0542  TempSrc: Oral  PainSc: 0-No pain   Pain Goal:                   [name]

## 2020-12-22 NOTE — Procedures (Addendum)
Post-Placental Nexplanon Insertion Procedure Note  Patient was identified. Informed consent was signed, signed copy in chart. A time-out was performed.    The insertion site was identified 8-10 cm (3-4 inches) from the medial epicondyle of the humerus and 3-5 cm (1.25-2 inches) posterior to (below) the sulcus (groove) between the biceps and triceps muscles of the patient's left arm and marked. The site was prepped and draped in the usual sterile fashion. Pt was prepped with alcohol swab and then injected with 7 cc of 1% lidocaine. The site was prepped with betadine. Nexplanon removed form packaging,  Device confirmed in needle, then inserted full length of needle and withdrawn per handbook instructions. Provider and patient verified presence of the implant in the woman's arm by palpation. Pt insertion site was covered with steristrips/adhesive bandage and pressure bandage. There was minimal blood loss. Patient tolerated procedure well.  Patient was given post procedure instructions and Nexplanon user card with expiration date. Condoms were recommended for STI prevention. Patient was asked to keep the pressure dressing on for 24 hours to minimize bruising and keep the adhesive bandage on for 3-5 days. The patient verbalized understanding of the plan of care and agrees.   Lot # [id number] Expiration Date  12/13/2022   Midwife attestation: I was gloved and present for this procedure in its entirety and I agree with the above resident's note.  Donette Larry, CNM 2:18 PM

## 2021-01-01 ENCOUNTER — Telehealth (HOSPITAL_COMMUNITY): Payer: Self-pay

## 2021-01-01 NOTE — Telephone Encounter (Signed)
"  I'm doing good." Patient declines any questions or concerns about her healing.  "She is doing well. We have been to the pediatrician and everything is fine. We have our next appointment scheduled. I feel she vomits a lot. It's a lot of it." RN asks patient how she is feeding baby. Patient states "I am breastfeeding her. I have enough milk." RN asks patient if she is burping the baby between breasts and after feeding? Patient states "yes". RN told patient to burp baby before feeding, between breasts, and after feeding. RN told patient to call her pediatrician and speak with them about her concern. Patient states that she does have her pediatrician phone number and will call. Patient has no other concerns or questions about baby.  EPDS score is 2.  Marcelino Duster [facility] 01/01/2021,1118

## 2021-10-23 ENCOUNTER — Emergency Department (HOSPITAL_BASED_OUTPATIENT_CLINIC_OR_DEPARTMENT_OTHER): Payer: Medicaid Other

## 2021-10-23 ENCOUNTER — Other Ambulatory Visit: Payer: Self-pay

## 2021-10-23 ENCOUNTER — Encounter (HOSPITAL_BASED_OUTPATIENT_CLINIC_OR_DEPARTMENT_OTHER): Payer: Self-pay | Admitting: Emergency Medicine

## 2021-10-23 ENCOUNTER — Emergency Department (HOSPITAL_BASED_OUTPATIENT_CLINIC_OR_DEPARTMENT_OTHER)
Admission: EM | Admit: 2021-10-23 | Discharge: 2021-10-23 | Disposition: A | Discharge status: Home / Self Care | Payer: Medicaid Other | Attending: Emergency Medicine | Admitting: Emergency Medicine

## 2021-10-23 DIAGNOSIS — W109XXA Fall (on) (from) unspecified stairs and steps, initial encounter: Secondary | ICD-10-CM | POA: Insufficient documentation

## 2021-10-23 DIAGNOSIS — S99911A Unspecified injury of right ankle, initial encounter: Secondary | ICD-10-CM | POA: Diagnosis present

## 2021-10-23 DIAGNOSIS — W19XXXA Unspecified fall, initial encounter: Secondary | ICD-10-CM

## 2021-10-23 DIAGNOSIS — S93401A Sprain of unspecified ligament of right ankle, initial encounter: Secondary | ICD-10-CM | POA: Insufficient documentation

## 2021-10-23 NOTE — Discharge Instructions (Addendum)
You were seen in the emergency department after a fall. ? ?As we discussed your x-ray did not show any broken bones.  I think you likely sprained your right ankle.  We have wrapped this for you.  I like you to continue to put ice on it, and take ibuprofen as needed for pain. ?

## 2021-10-23 NOTE — ED Triage Notes (Signed)
Pt arrives pov, to triage in wheelchair, endorses fall down stairs, c/o right foot pain. Swelling and decreased ROM noted ?

## 2021-10-23 NOTE — ED Provider Notes (Signed)
?[place] EMERGENCY DEPARTMENT ?Provider Note ? ? ?CSN: [id number] ?Arrival date & time: 10/23/21  1712 ? ?  ? ?History ? ?Chief Complaint  ?Patient presents with  ? Fall  ? ? ?Elizabeth Odonnell is a 36 y.o. female who presents the emergency department after a fall.  Patient states that she had fallen down some stairs, and ever since then has had pain in her right ankle.  This occurred just prior to ER arrival.  She states her pain is made worse with bearing weight on the ankle.  No other trauma noted.  No numbness ? ? ?Fall ? ? ?  ? ?Home Medications ?Prior to Admission medications   ?Medication Sig Start Date End Date Taking? Authorizing Provider  ?acetaminophen (TYLENOL) 325 MG tablet Take 2 tablets (650 mg total) by mouth every 6 (six) hours as needed for mild pain, moderate pain, fever or headache (for pain scale < 4). 12/22/20   Randa Ngo, MD  ?coconut oil OIL Apply 1 application topically as needed (nipple pain). 12/22/20   Randa Ngo, MD  ?ibuprofen (ADVIL) 600 MG tablet Take 1 tablet (600 mg total) by mouth every 6 (six) hours. 12/22/20   Randa Ngo, MD  ?prenatal vitamin w/FE, FA (PRENATAL 1 + 1) 27-1 MG TABS tablet Take 1 tablet by mouth daily at 12 noon. 08/03/20   Laury Deep, CNM  ?   ? ?Allergies    ?Other   ? ?Review of Systems   ?Review of Systems  ?Musculoskeletal:   ?     R foot and ankle pain  ?Neurological:  Negative for weakness and numbness.  ?All other systems reviewed and are negative. ? ?Physical Exam ?Updated Vital Signs ?BP 122/73 (BP Location: Right Arm)   Pulse 89   Temp 98.5 ?F (36.9 ?C) (Oral)   Resp 18   Ht 5' (1.524 m)   Wt 55 kg   SpO2 100%   BMI 23.68 kg/m?  ?Physical Exam ?Vitals and nursing note reviewed.  ?Constitutional:   ?   Appearance: Normal appearance.  ?HENT:  ?   Head: Normocephalic and atraumatic.  ?Eyes:  ?   Conjunctiva/sclera: Conjunctivae normal.  ?Pulmonary:  ?   Effort: Pulmonary effort is normal. No respiratory  distress.  ?Musculoskeletal:  ?   Comments: Mild soft tissue swelling noted over the lateral malleolus, with some focal tenderness.  No ecchymoses noted.  Sensation intact in bilateral lower extremities.  Able to wiggle toes.  Slightly decreased passive ROM of ankle to dorsiflexion due to pain.  Good capillary refill.  ?Skin: ?   General: Skin is warm and dry.  ?Neurological:  ?   Mental Status: She is alert.  ?Psychiatric:     ?   Mood and Affect: Mood normal.     ?   Behavior: Behavior normal.  ? ? ?ED Results / Procedures / Treatments   ?Labs ?(all labs ordered are listed, but only abnormal results are displayed) ?Labs Reviewed - No data to display ? ?EKG ?None ? ?Radiology ?DG Foot Complete Right ? ?Result Date: 10/23/2021 ?CLINICAL DATA:  Status post fall. EXAM: RIGHT FOOT COMPLETE - 3+ VIEW COMPARISON:  None Available. FINDINGS: There is no evidence of fracture or dislocation. There is no evidence of arthropathy or other focal bone abnormality. Soft tissues are unremarkable. IMPRESSION: Negative. Electronically Signed   By: Virgina Norfolk M.D.   On: 10/23/2021 17:47   ? ?Procedures ?Procedures  ? ? ?Medications Ordered  in ED ?Medications - No data to display ? ?ED Course/ Medical Decision Making/ A&P ?  ?                        ?Medical Decision Making ?Amount and/or Complexity of Data Reviewed ?Radiology: ordered. ? ? ?This patient is a 36 y.o. female  who presents to the ED for concern of right ankle injury after fall.  ? ?Differential diagnoses prior to evaluation: ?The emergent differential diagnosis includes, but is not limited to, fracture, dislocation, ligamentous injury, ankle sprain. This is not an exhaustive differential.  ? ?Past Medical History / Co-morbidities: ?Past Medical History:  ?Diagnosis Date  ? Medical history non-contributory   ? Pelvic pain   ? ?Physical Exam: ?Physical exam performed. The pertinent findings include: Mild soft tissue swelling noted over the lateral malleolus, with  some focal tenderness.  Neurovascularly and neurovascularly intact in bilateral lower extremities. ? ?Lab Tests/Imaging studies: ?I Ordered, and personally interpreted labs/imaging including right ankle x-ray.  The pertinent results include: No acute fractures or dislocations. I agree with the radiologist interpretation. ?  ?Disposition: ?After consideration of the diagnostic results and the patients response to treatment, I feel that patient's not requiring admission or inpatient treatment for her symptoms.  We will wrap patient's ankle with Ace wrap, and recommend rice therapy for treatment. Discussed reasons to return to the emergency department, and the patient is agreeable to the plan.  ? ?Final Clinical Impression(s) / ED Diagnoses ?Final diagnoses:  ?Sprain of right ankle, unspecified ligament, initial encounter  ?Fall, initial encounter  ? ? ?Rx / DC Orders ?ED Discharge Orders   ? ? None  ? ?  ? ?Portions of this report may have been transcribed using voice recognition software. Every effort was made to ensure accuracy; however, inadvertent computerized transcription errors may be present. ? ?  ?Kateri Plummer, PA-C ?10/23/21 1844 ? ?  ?Lucrezia Starch, MD ?10/23/21 2117 ? ?

## 2022-07-05 ENCOUNTER — Emergency Department (HOSPITAL_COMMUNITY)
Admission: EM | Admit: 2022-07-05 | Discharge: 2022-07-05 | Disposition: A | Discharge status: Home / Self Care | Payer: Medicaid Other | Attending: Emergency Medicine | Admitting: Emergency Medicine

## 2022-07-05 ENCOUNTER — Encounter (HOSPITAL_COMMUNITY): Payer: Self-pay | Admitting: *Deleted

## 2022-07-05 ENCOUNTER — Emergency Department (HOSPITAL_COMMUNITY): Payer: Medicaid Other

## 2022-07-05 ENCOUNTER — Other Ambulatory Visit: Payer: Self-pay

## 2022-07-05 DIAGNOSIS — N939 Abnormal uterine and vaginal bleeding, unspecified: Secondary | ICD-10-CM | POA: Insufficient documentation

## 2022-07-05 DIAGNOSIS — J101 Influenza due to other identified influenza virus with other respiratory manifestations: Secondary | ICD-10-CM | POA: Diagnosis not present

## 2022-07-05 DIAGNOSIS — R059 Cough, unspecified: Secondary | ICD-10-CM | POA: Diagnosis present

## 2022-07-05 DIAGNOSIS — Z20822 Contact with and (suspected) exposure to covid-19: Secondary | ICD-10-CM | POA: Diagnosis not present

## 2022-07-05 LAB — CBC WITH DIFFERENTIAL/PLATELET
Abs Immature Granulocytes: 0.01 10*3/uL (ref 0.00–0.07)
Basophils Absolute: 0 10*3/uL (ref 0.0–0.1)
Basophils Relative: 1 %
Eosinophils Absolute: 0.1 10*3/uL (ref 0.0–0.5)
Eosinophils Relative: 2 %
HCT: 39.7 % (ref 36.0–46.0)
Hemoglobin: 13.2 g/dL (ref 12.0–15.0)
Immature Granulocytes: 0 %
Lymphocytes Relative: 56 %
Lymphs Abs: 2.5 10*3/uL (ref 0.7–4.0)
MCH: 26.8 pg (ref 26.0–34.0)
MCHC: 33.2 g/dL (ref 30.0–36.0)
MCV: 80.5 fL (ref 80.0–100.0)
Monocytes Absolute: 0.2 10*3/uL (ref 0.1–1.0)
Monocytes Relative: 6 %
Neutro Abs: 1.5 10*3/uL — ABNORMAL LOW (ref 1.7–7.7)
Neutrophils Relative %: 35 %
Platelets: 169 10*3/uL (ref 150–400)
RBC: 4.93 MIL/uL (ref 3.87–5.11)
RDW: 13.3 % (ref 11.5–15.5)
Smear Review: ADEQUATE
WBC: 4.1 10*3/uL (ref 4.0–10.5)
nRBC: 0 % (ref 0.0–0.2)

## 2022-07-05 LAB — BASIC METABOLIC PANEL
Anion gap: 11 (ref 5–15)
BUN: 6 mg/dL (ref 6–20)
CO2: 23 mmol/L (ref 22–32)
Calcium: 8.7 mg/dL — ABNORMAL LOW (ref 8.9–10.3)
Chloride: 104 mmol/L (ref 98–111)
Creatinine, Ser: 0.44 mg/dL (ref 0.44–1.00)
GFR, Estimated: >60 mL/min (ref >60)
Glucose, Bld: 86 mg/dL (ref 70–99)
Potassium: 4.5 mmol/L (ref 3.5–5.1)
Sodium: 138 mmol/L (ref 135–145)

## 2022-07-05 LAB — RESP PANEL BY RT-PCR (RSV, FLU A&B, COVID)  RVPGX2
Influenza A by PCR: NEGATIVE
Influenza B by PCR: POSITIVE — AB
Resp Syncytial Virus by PCR: NEGATIVE
SARS Coronavirus 2 by RT PCR: NEGATIVE

## 2022-07-05 LAB — I-STAT BETA HCG BLOOD, ED (MC, WL, AP ONLY): I-stat hCG, quantitative: <5 m[IU]/mL (ref <5)

## 2022-07-05 MED ORDER — METHYLPREDNISOLONE 4 MG PO TBPK: ORAL_TABLET | ORAL | 0 refills | Status: DC

## 2022-07-05 MED ORDER — BENZONATATE 100 MG PO CAPS: 100.0000 mg | ORAL_CAPSULE | Freq: Three times a day (TID) | ORAL | 0 refills | Status: DC

## 2022-07-05 NOTE — Discharge Instructions (Signed)
As we discussed, you tested positive for the flu today.  As this is a viral illness, no antibiotics are indicated. Your work-up in the ER today was reassuring for acute findings. I recommend that you get plenty of rest and focus on symptomatic relief which includes Cepacol throat lozenges for sore throat, Mucinex D (orange box) which you can get from behind the counter at your local pharmacy for congestion, and tylenol/ibuprofen as needed for fevers and bodyaches. I have also given you a prescription for medrol dosepak and tessalon which is a cough medication for you to take as prescribed for management of your symptoms. I also recommend:  Increased fluid intake. Sports drinks offer valuable electrolytes, sugars, and fluids.  Breathing heated mist or steam (vaporizer or shower).  Eating chicken soup or other clear broths, and maintaining good nutrition.   Increasing usage of your inhaler if you have asthma.  Return to work when your temperature has returned to normal.  Gargle warm salt water and spit it out for sore throat. Take benadryl or Zyrtec to decrease sinus secretions.  Follow Up: Follow up with your primary care doctor in 5-7 days for recheck of ongoing symptoms.  Return to emergency department for emergent changing or worsening of symptoms.  Additionally, your vaginal bleeding is likely due to irregular bleeding that is very common with the Nexplanon implant.  I have given you a referral to OB/GYN with a number to call to schedule an appointment for continued evaluation and management of this.  Return if development of any new or worsening symptoms.

## 2022-07-05 NOTE — ED Provider Notes (Signed)
[place] EMERGENCY DEPARTMENT AT [facility] Provider Note   CSN: [id number] Arrival date & time: 07/05/22  1051     History  Chief Complaint  Patient presents with   Vaginal Bleeding   Cough    Elizabeth Odonnell Elizabeth Odonnell is a 37 y.o. female.  Patient with no pertinent past medical history presents today with complaints of cough and vaginal bleeding.  She states that both of the symptoms began around the same time around 20 days ago.  She states that her cough has been nonproductive in nature until 3 days ago when she began coughing up white phlegm.  She denies any chest pain or shortness of breath.  No meds PTA.  Denies any fevers or chills.  No known sick contacts.  States that her vaginal bleeding has been persistent and unchanged in nature for the last 20 days.  States she is going through approximately 6 regular size pads per day.  She states that she has a Nexplanon in place and has had it for about a year and a half now and since she had the implant placed she has not had a menstrual cycle until 20 days ago. She denies abdominal pain, nausea, vomiting, or diarrhea. Denies dysuria or hematuria. Denies vaginal discharge.   The history is provided by the patient. A language interpreter was used (Arabic interpreter used).  Vaginal Bleeding Cough      Home Medications Prior to Admission medications   Medication Sig Start Date End Date Taking? Authorizing Provider  acetaminophen (TYLENOL) 325 MG tablet Take 2 tablets (650 mg total) by mouth every 6 (six) hours as needed for mild pain, moderate pain, fever or headache (for pain scale < 4). 12/22/20   Sheila Oats, MD  coconut oil OIL Apply 1 application topically as needed (nipple pain). 12/22/20   Sheila Oats, MD  ibuprofen (ADVIL) 600 MG tablet Take 1 tablet (600 mg total) by mouth every 6 (six) hours. 12/22/20   Sheila Oats, MD  prenatal vitamin w/FE, FA (PRENATAL 1 + 1) 27-1 MG TABS tablet Take 1 tablet by  mouth daily at 12 noon. 08/03/20   Raelyn Mora, CNM      Allergies    Other    Review of Systems   Review of Systems  Respiratory:  Positive for cough.   Genitourinary:  Positive for vaginal bleeding.  All other systems reviewed and are negative.   Physical Exam Updated Vital Signs BP 128/87 (BP Location: Right Arm)   Pulse 76   Temp 98.9 F (37.2 C)   Resp 18   LMP 06/15/2022 (Exact Date)   SpO2 100%  Physical Exam Vitals and nursing note reviewed.  Constitutional:      General: She is not in acute distress.    Appearance: Normal appearance. She is normal weight. She is not ill-appearing, toxic-appearing or diaphoretic.  HENT:     Head: Normocephalic and atraumatic.  Cardiovascular:     Rate and Rhythm: Normal rate and regular rhythm.     Heart sounds: Normal heart sounds.  Pulmonary:     Effort: Pulmonary effort is normal. No respiratory distress.     Breath sounds: Normal breath sounds.  Abdominal:     General: Abdomen is flat.     Palpations: Abdomen is soft.     Tenderness: There is no right CVA tenderness or left CVA tenderness.  Genitourinary:    Comments: Patient refused pelvic exam Musculoskeletal:  General: Normal range of motion.     Cervical back: Normal range of motion.  Skin:    General: Skin is warm and dry.  Neurological:     General: No focal deficit present.     Mental Status: She is alert.  Psychiatric:        Mood and Affect: Mood normal.        Behavior: Behavior normal.     ED Results / Procedures / Treatments   Labs (all labs ordered are listed, but only abnormal results are displayed) Labs Reviewed  RESP PANEL BY RT-PCR (RSV, FLU A&B, COVID)  RVPGX2 - Abnormal; Notable for the following components:      Result Value   Influenza B by PCR POSITIVE (*)    All other components within normal limits  CBC WITH DIFFERENTIAL/PLATELET - Abnormal; Notable for the following components:   Neutro Abs 1.5 (*)    All other components  within normal limits  BASIC METABOLIC PANEL - Abnormal; Notable for the following components:   Calcium 8.7 (*)    All other components within normal limits  WET PREP, GENITAL  I-STAT BETA HCG BLOOD, ED ([facility], [facility], [facility] ONLY)  GC/CHLAMYDIA PROBE AMP ([place]) NOT AT [facility]    EKG None  Radiology DG Chest 2 View  Result Date: 07/05/2022 CLINICAL DATA:  Cough. Patient reports 20 days of cough EXAM: CHEST - 2 VIEW COMPARISON:  None Available. FINDINGS: The cardiomediastinal contours are normal. The lungs are clear. Pulmonary vasculature is normal. No consolidation, pleural effusion, or pneumothorax. No acute osseous abnormalities are seen. IMPRESSION: Negative radiographs of the chest. Electronically Signed   By: Keith Rake M.D.   On: 07/05/2022 17:57    Procedures Procedures    Medications Ordered in ED Medications - No data to display  ED Course/ Medical Decision Making/ A&P                             Medical Decision Making Amount and/or Complexity of Data Reviewed Labs: ordered. Radiology: ordered.   This patient is a 37 y.o. female who presents to the ED for concern of cough, vaginal bleeding, this involves an extensive number of treatment options, and is a complaint that carries with it a high risk of complications and morbidity. The emergent differential diagnosis prior to evaluation includes, but is not limited to,  URI, pneumonia, Abnormal uterine bleeding, threatened miscarriage, incomplete miscarriage, normal bleeding from an early trimester pregnancy, ectopic pregnancy, vaginal/cervical trauma, subchorionic hemorrhage/hematoma    This is not an exhaustive differential.   Past Medical History / Co-morbidities / Social History: N/A  Physical Exam: Physical exam performed. The pertinent findings include: No acute physical exam findings, patient refused pelvic exam  Lab Tests: I ordered, and personally interpreted labs.  The pertinent results include:  Flu B +,  hemoglobin normal.  No other acute laboratory findings   Imaging Studies: I ordered imaging studies including CXR. I independently visualized and interpreted imaging which showed NAD. I agree with the radiologist interpretation.   Disposition:  Presents today with complaints of cough and vaginal bleeding.  She is afebrile, nontoxic-appearing, in no acute distress with reassuring vital signs.  Physical exam reveals no acute findings, patient refused pelvic exam.  Abdomen soft and nontender.  Patient found to be positive for flu B.  Chest x-ray reveals no signs of pneumonia.  Symptoms consistent with same. Given that she has been coughing for  20 days, will send for Medrol Dosepak for management.  Will also send for Tessalon.  Patient's vaginal bleeding likely due to her Nexplanon and no menstrual cycle for over a year.  Her hemoglobin is stable.  No abdominal pain.  She did refuse a pelvic exam, therefore unable to further evaluate. Pregnancy negative. Plan to send to OB/GYN for follow-up. Patient is stable for discharge. She is understanding and amenable with plan, educated on red flag symptoms that would prompt immediate return. Patient discharged in stable condition.   Final Clinical Impression(s) / ED Diagnoses Final diagnoses:  Vaginal bleeding  Influenza B    Rx / DC Orders ED Discharge Orders          Ordered    methylPREDNISolone (MEDROL DOSEPAK) 4 MG TBPK tablet        07/05/22 1952    benzonatate (TESSALON) 100 MG capsule  Every 8 hours        07/05/22 1952          An After Visit Summary was printed and given to the patient.     Nestor Lewandowsky 07/05/22 Sharolyn Douglas, MD 07/06/22 2136

## 2022-07-05 NOTE — ED Triage Notes (Signed)
Pt is here with vaginal bleeding for 20 days and states that the bleeding is heavier. She reports the bleeding as dark. Pt does report left lower back pain. Pt has implant to prevent pregnancy. Pt reports a cough for 20 days as well. No fevers. No SOB

## 2022-07-05 NOTE — ED Notes (Signed)
Patient verbalizes understanding of d/c instructions. Opportunities for questions and answers were provided. Pt d/c from ED and ambulated to lobby.

## 2023-02-28 ENCOUNTER — Ambulatory Visit: Payer: Medicaid Other | Admitting: Obstetrics and Gynecology

## 2023-02-28 ENCOUNTER — Other Ambulatory Visit: Payer: Self-pay

## 2023-02-28 ENCOUNTER — Encounter: Payer: Self-pay | Admitting: Obstetrics and Gynecology

## 2023-02-28 VITALS — BP 128/79 | HR 97 | Wt 131.7 lb

## 2023-02-28 DIAGNOSIS — Z975 Presence of (intrauterine) contraceptive device: Secondary | ICD-10-CM

## 2023-02-28 DIAGNOSIS — N921 Excessive and frequent menstruation with irregular cycle: Secondary | ICD-10-CM

## 2023-02-28 MED ORDER — CELECOXIB 200 MG PO CAPS: 200.0000 mg | ORAL_CAPSULE | Freq: Every day | ORAL | 1 refills | Status: AC

## 2023-02-28 NOTE — Progress Notes (Signed)
NEW GYNECOLOGY PATIENT Patient name: Elizabeth Odonnell MRN [id number]  Date of birth: Nov 18, 1985 Chief Complaint:   Vaginal Bleeding     History:  Elizabeth Odonnell Elizabeth Odonnell is a 37 y.o. [phone] being seen today for abnormal bleeding.  Reports having abnormal bleeding x 6 months without cramping along with headaches. Menses was 8/20 and then bleeding again starting 9/11. First 25 days was daily bleeding when the bleeding started. Since then will have 3 days of bleeding at a time.  When bleeding, will change at various frequencies - flow varies on the day. Currently has only changed her pad twice in the last day.   Nexplaono in place since 12/22/2020; had not had not had bleeding during the first 1.5 years after insertion. First time having nexplanon. No new prescription medications. Has been losing hair and weight gain. Having diarrhea. Nexplanon inserted right after edliveyr of last baby. Had just stopped breastfeeding prior to the onset of bleeding.      Gynecologic History Patient's last menstrual period was 01/30/2023 (exact date). Contraception: Nexplanon Last Pap:     Component Value Date/Time   DIAGPAP  07/05/2020 1441    - Negative for intraepithelial lesion or malignancy (NILM)   DIAGPAP  09/19/2017 0000    NEGATIVE FOR INTRAEPITHELIAL LESIONS OR MALIGNANCY.   DIAGPAP  09/19/2017 0000    FUNGAL ORGANISMS PRESENT CONSISTENT WITH CANDIDA SPP.   HPVHIGH Negative 07/05/2020 1441   ADEQPAP  07/05/2020 1441    Satisfactory for evaluation; transformation zone component ABSENT.   ADEQPAP  09/19/2017 0000    Satisfactory for evaluation  endocervical/transformation zone component PRESENT.    High Risk HPV: Positive  Adequacy:  Satisfactory for evaluation, transformation zone component PRESENT  Diagnosis:  Atypical squamous cells of undetermined significance (ASC-US) .  Last Mammogram: n/a Last Colonoscopy: n/a  Obstetric History OB History  Gravida Para Term  Preterm AB Living  3 3 3  0 0 3  SAB IAB Ectopic Multiple Live Births  0 0 0 0 3    # Outcome Date GA Lbr Len/2nd Weight Sex Type Anes PTL Lv  3 Term 12/21/20 [redacted]w[redacted]d 02:58 / 00:07 8 lb 0.4 oz (3.64 kg) F Vag-Spont EPI  LIV  2 Term 03/12/18 [redacted]w[redacted]d 03:42 / 00:27 8 lb 6.9 oz (3.824 kg) M Vag-Spont EPI  LIV     Birth Comments: IOL Postdates  1 Term 02/23/15 [redacted]w[redacted]d  6 lb 9.8 oz (3 kg) M Vag-Spont Local N LIV     Birth Comments: postdates    Past Medical History:  Diagnosis Date   Medical history non-contributory    Pelvic pain     Past Surgical History:  Procedure Laterality Date   NO PAST SURGERIES      Current Outpatient Medications on File Prior to Visit  Medication Sig Dispense Refill   acetaminophen (TYLENOL) 325 MG tablet Take 2 tablets (650 mg total) by mouth every 6 (six) hours as needed for mild pain, moderate pain, fever or headache (for pain scale < 4).     benzonatate (TESSALON) 100 MG capsule Take 1 capsule (100 mg total) by mouth every 8 (eight) hours. (Patient not taking: Reported on 02/28/2023) 21 capsule 0   coconut oil OIL Apply 1 application topically as needed (nipple pain). (Patient not taking: Reported on 02/28/2023)  0   ibuprofen (ADVIL) 600 MG tablet Take 1 tablet (600 mg total) by mouth every 6 (six) hours. (Patient not taking: Reported on 02/28/2023) 30 tablet 0  methylPREDNISolone (MEDROL DOSEPAK) 4 MG TBPK tablet Take as directed on package (Patient not taking: Reported on 02/28/2023) 1 each 0   prenatal vitamin w/FE, FA (PRENATAL 1 + 1) 27-1 MG TABS tablet Take 1 tablet by mouth daily at 12 noon. (Patient not taking: Reported on 02/28/2023) 30 tablet 11   No current facility-administered medications on file prior to visit.    Allergies  Allergen Reactions   Other Itching    Eggplant--itching in and around mouth    Social History:  reports that she has never smoked. She has never used smokeless tobacco. She reports that she does not drink alcohol and does not  use drugs.  Family History  Problem Relation Age of Onset   Diabetes Mother     The following portions of the patient's history were reviewed and updated as appropriate: allergies, current medications, past family history, past medical history, past social history, past surgical history and problem list.  Review of Systems Pertinent items noted in HPI and remainder of comprehensive ROS otherwise negative.  Physical Exam:  BP 128/79   Pulse 97   Wt 131 lb 11.2 oz (59.7 kg)   LMP 01/30/2023 (Exact Date)   BMI 25.72 kg/m  Physical Exam Vitals and nursing note reviewed. Exam conducted with a chaperone present.  Constitutional:      Appearance: Normal appearance.  Cardiovascular:     Rate and Rhythm: Normal rate.  Pulmonary:     Effort: Pulmonary effort is normal.     Breath sounds: Normal breath sounds.  Genitourinary:    General: Normal vulva.     Exam position: Lithotomy position.     Vagina: Normal.     Cervix: Normal.     Uterus: Normal.   Neurological:     General: No focal deficit present.     Mental Status: She is alert and oriented to person, place, and time.  Psychiatric:        Mood and Affect: Mood normal.        Behavior: Behavior normal.        Thought Content: Thought content normal.        Judgment: Judgment normal.        Assessment and Plan:   1. Breakthrough bleeding on Nexplanon Likely secondary to no longer breastfeeding. Will get labs and trial of scheduled NSAIDs for BTB. If no response, can trial POP or OCPs or removal altogether. Uterus felt to be normal size and shape on exam and cervix appears normal on exam as well.   - CBC - Prolactin - TSH Rfx on Abnormal to Free T4 - Hemoglobin A1c - celecoxib (CELEBREX) 200 MG capsule; Take 1 capsule (200 mg total) by mouth daily for 7 days.  Dispense: 7 capsule; Refill: 1    Routine preventative health maintenance measures emphasized. Please refer to After Visit Summary for other counseling  recommendations.   Follow-up: No follow-ups on file.      Lorriane Shire, MD Obstetrician & Gynecologist, Faculty Practice Minimally Invasive Gynecologic Surgery Center for [employer], [facility]

## 2023-02-28 NOTE — Patient Instructions (Signed)
Take celebrex once daily when having bleeding. Do not take ibuprofen while taking this medication.

## 2023-03-01 ENCOUNTER — Other Ambulatory Visit: Payer: Self-pay | Admitting: Obstetrics and Gynecology

## 2023-03-01 DIAGNOSIS — Z975 Presence of (intrauterine) contraceptive device: Secondary | ICD-10-CM

## 2023-03-01 LAB — CBC
Hematocrit: 35.5 % (ref 34.0–46.6)
Hemoglobin: 10.9 g/dL — ABNORMAL LOW (ref 11.1–15.9)
MCH: 23.5 pg — ABNORMAL LOW (ref 26.6–33.0)
MCHC: 30.7 g/dL — ABNORMAL LOW (ref 31.5–35.7)
MCV: 77 fL — ABNORMAL LOW (ref 79–97)
Platelets: 333 10*3/uL (ref 150–450)
RBC: 4.64 x10E6/uL (ref 3.77–5.28)
RDW: 14.9 % (ref 11.7–15.4)
WBC: 7.9 10*3/uL (ref 3.4–10.8)

## 2023-03-01 LAB — PROLACTIN: Prolactin: 6.5 ng/mL (ref 4.8–33.4)

## 2023-03-01 LAB — HEMOGLOBIN A1C
Est. average glucose Bld gHb Est-mCnc: 114 mg/dL
Hgb A1c MFr Bld: 5.6 % (ref 4.8–5.6)

## 2023-03-01 LAB — TSH RFX ON ABNORMAL TO FREE T4: TSH: 1.86 u[IU]/mL (ref 0.450–4.500)

## 2023-03-01 MED ORDER — FERROUS GLUCONATE 324 (38 FE) MG PO TABS: 324.0000 mg | ORAL_TABLET | ORAL | 3 refills | Status: DC

## 2023-03-02 ENCOUNTER — Telehealth: Payer: Self-pay

## 2023-03-02 NOTE — Telephone Encounter (Signed)
-----   Message from Gamaliel sent at 03/01/2023  4:55 PM EDT ----- Notify that thyroid, A1c and prolactin are all normal. Thre is mild anemia, recommend taking iron pills daily (rx sent to pharmacy). Given celebrex to take to help with bleeding. If bleeding does not improve, can do other pills or remove the nexplanon altogether.    Called pt with [facility] interpreter ID [phone]. Results and supplement reviewed. Will call back if bleeding continues.

## 2023-06-07 ENCOUNTER — Ambulatory Visit: Payer: Medicaid Other | Admitting: Family Medicine

## 2023-06-07 ENCOUNTER — Encounter: Payer: Self-pay | Admitting: Family Medicine

## 2023-06-07 ENCOUNTER — Other Ambulatory Visit: Payer: Self-pay

## 2023-06-07 VITALS — BP 120/79 | HR 86 | Ht 62.5 in | Wt 133.7 lb

## 2023-06-07 DIAGNOSIS — Z3046 Encounter for surveillance of implantable subdermal contraceptive: Secondary | ICD-10-CM | POA: Diagnosis not present

## 2023-06-07 NOTE — Progress Notes (Signed)
     GYNECOLOGY OFFICE PROCEDURE NOTE  Elizabeth Odonnell is a 37 y.o. [phone] here for Nexplanon removal. In x 1.5 years and irregular spotting that has not responded to NSAIDS or trial of OCPs.  Last pap smear was on 07/03/2020 and was normal.  No other gynecologic concerns.   Nexplanon Removal Patient identified, informed consent performed, consent signed.   Appropriate time out taken. Nexplanon site identified.  Area prepped in usual sterile fashon. One ml of 1% lidocaine was used to anesthetize the area at the distal end of the implant. A small stab incision was made right beside the implant on the distal portion.  The Nexplanon rod was grasped using hemostats and removed without difficulty.  There was minimal blood loss. There were no complications.  3 ml of 1% lidocaine was injected around the incision for post-procedure analgesia.  Steri-strips were applied over the small incision.  A pressure bandage was applied to reduce any bruising.  The patient tolerated the procedure well and was given post procedure instructions.  Patient is planning to use nothing for contraception.   Return in about 5 weeks (around 07/12/2023) for a CPE.  [name], MD 06/07/2023 2:57 PM

## 2023-07-16 ENCOUNTER — Ambulatory Visit: Payer: Medicaid Other | Admitting: Certified Nurse Midwife

## 2023-07-16 DIAGNOSIS — N939 Abnormal uterine and vaginal bleeding, unspecified: Secondary | ICD-10-CM

## 2023-07-16 NOTE — Progress Notes (Signed)
Patient canceled appointment. May reschedule.   Lamont Snowball, MSN, CNM, RNC-OB Certified Nurse Midwife, [facility] 07/16/2023 9:53 AM

## 2023-07-17 IMAGING — DX DG FOOT COMPLETE 3+V*R*
3 series · 3 of 3 positions shown · non-contrast
Comparison: None Available.

CLINICAL DATA: Status post fall.

EXAM:
RIGHT FOOT COMPLETE - 3+ VIEW

[foot ap]
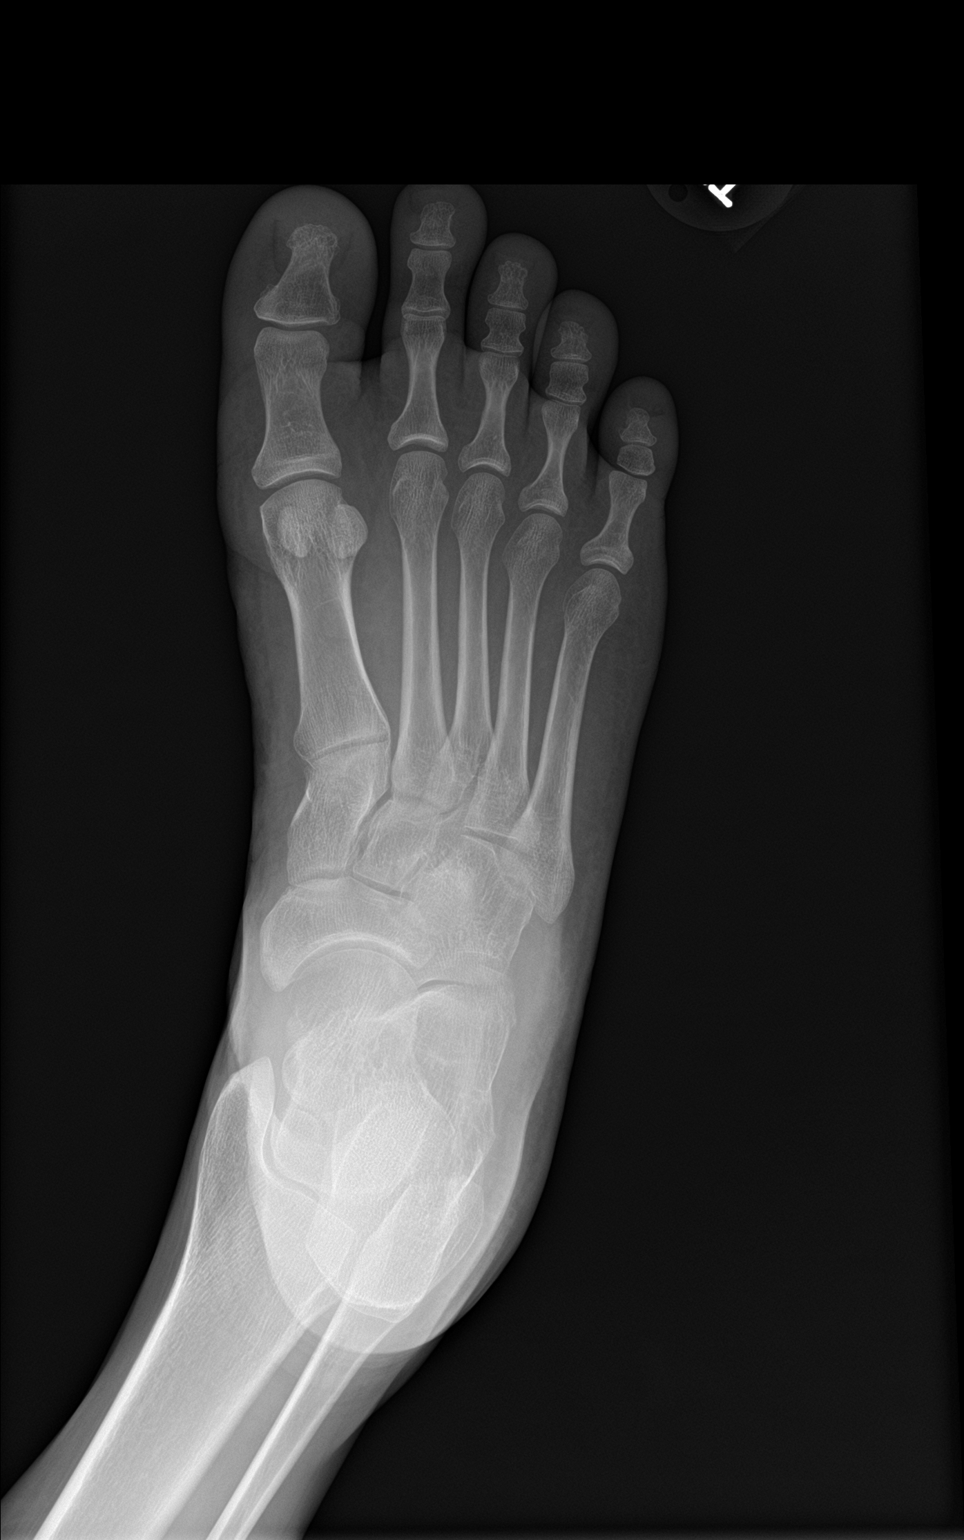

[foot obl]
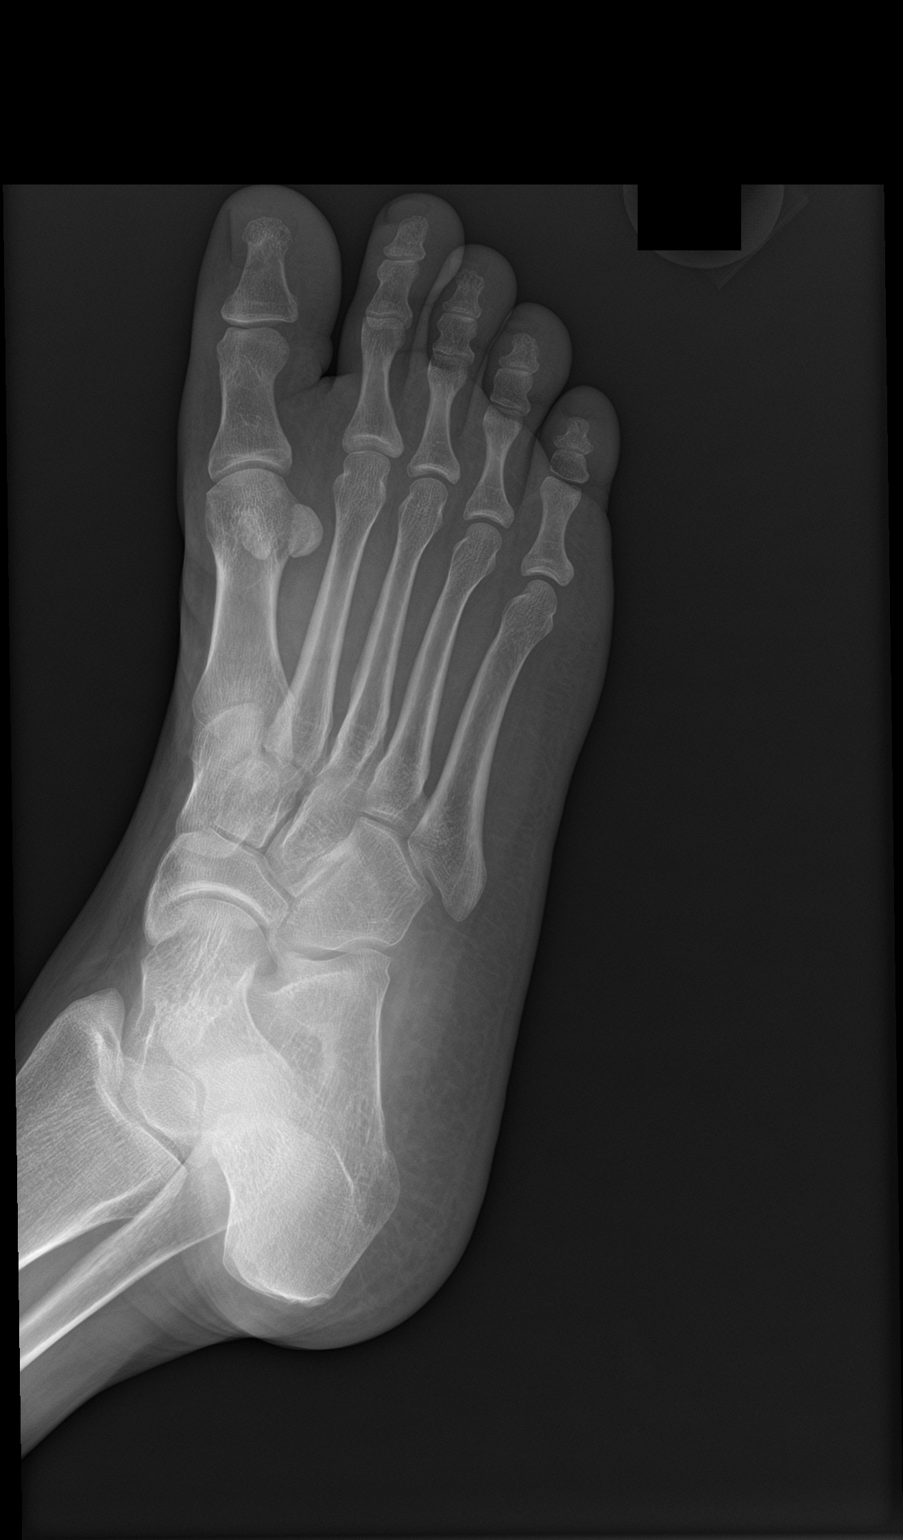

[foot lat]
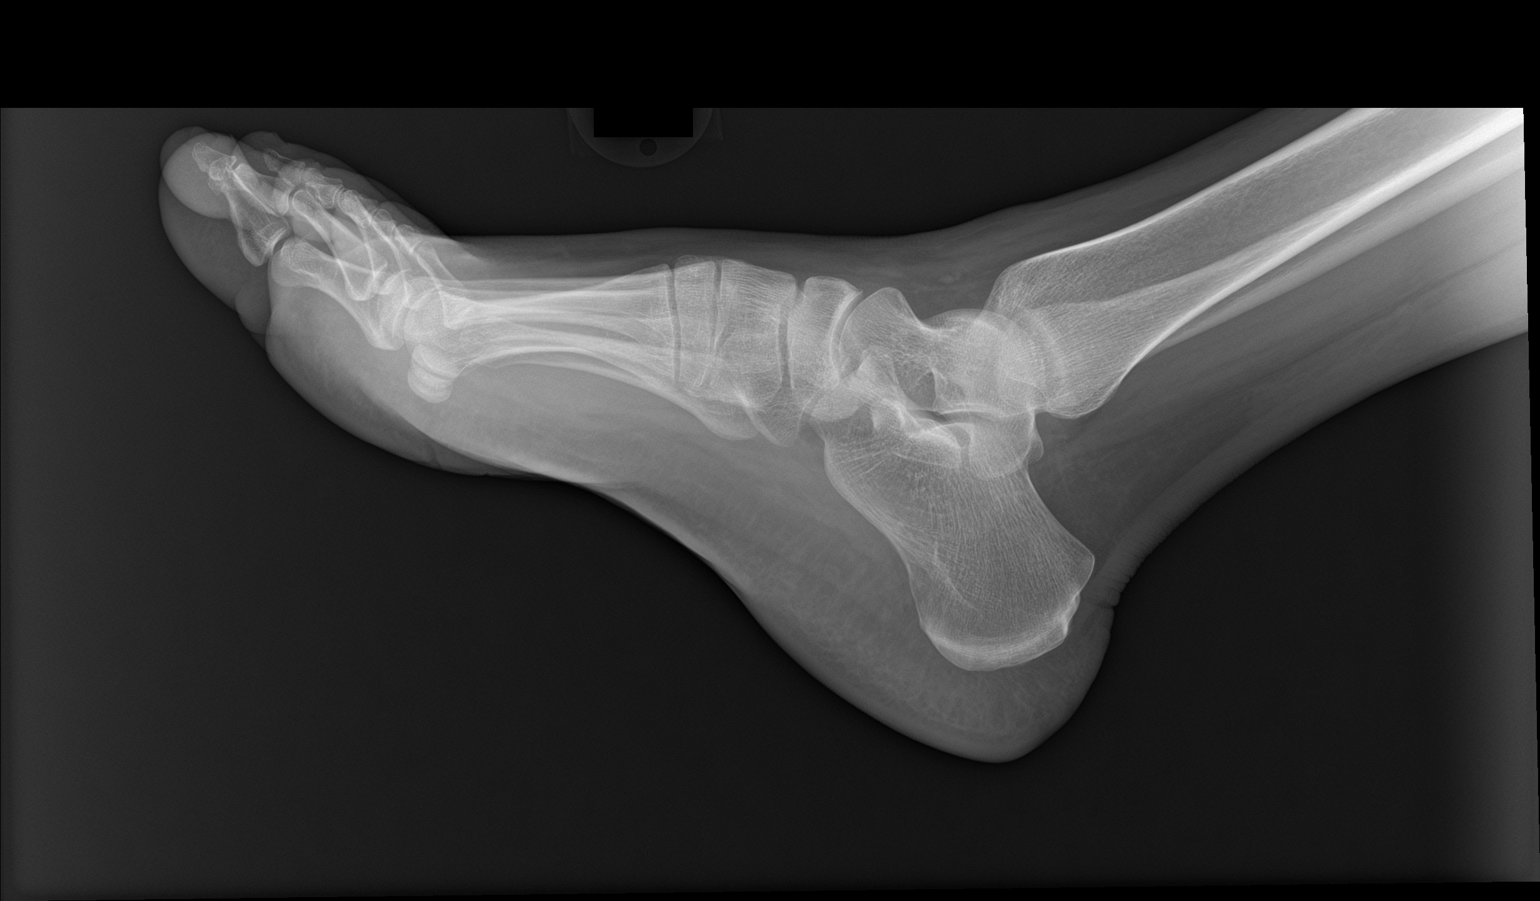

[3 of 3 positions shown; findings below may reference images not displayed]

FINDINGS: There is no evidence of fracture or dislocation. There is no
evidence of arthropathy or other focal bone abnormality. Soft
tissues are unremarkable.
IMPRESSION: Negative.

## 2023-07-25 ENCOUNTER — Ambulatory Visit (INDEPENDENT_AMBULATORY_CARE_PROVIDER_SITE_OTHER): Payer: Medicaid Other | Admitting: Family Medicine

## 2023-07-25 ENCOUNTER — Other Ambulatory Visit: Payer: Self-pay

## 2023-07-25 ENCOUNTER — Other Ambulatory Visit (HOSPITAL_COMMUNITY)
Admission: RE | Admit: 2023-07-25 | Discharge: 2023-07-25 | Disposition: A | Discharge status: Home / Self Care | Payer: Medicaid Other | Source: Ambulatory Visit | Attending: Obstetrics and Gynecology | Admitting: Obstetrics and Gynecology

## 2023-07-25 VITALS — BP 118/80 | HR 102 | Wt 130.0 lb

## 2023-07-25 DIAGNOSIS — Z01419 Encounter for gynecological examination (general) (routine) without abnormal findings: Secondary | ICD-10-CM

## 2023-07-25 DIAGNOSIS — Z30011 Encounter for initial prescription of contraceptive pills: Secondary | ICD-10-CM

## 2023-07-25 DIAGNOSIS — Z3202 Encounter for pregnancy test, result negative: Secondary | ICD-10-CM

## 2023-07-25 LAB — POCT PREGNANCY, URINE: Preg Test, Ur: NEGATIVE

## 2023-07-25 MED ORDER — LO LOESTRIN FE 1 MG-10 MCG / 10 MCG PO TABS: 1.0000 | ORAL_TABLET | Freq: Every day | ORAL | 4 refills | Status: DC

## 2023-07-25 NOTE — Progress Notes (Signed)
GYNECOLOGY ANNUAL PREVENTATIVE CARE ENCOUNTER NOTE  Subjective:   Elizabeth Odonnell is a 38 y.o. [phone] female here for a routine annual gynecologic exam.  Current complaints:  Dec- normal period Missed Jan- got sick and after getting sick she had her feb period Feb period was abnormal --with blood clots. Heavy, changing pad every 3-4 hours for 4 days. Wants to start OCP Had unprotected sex in Jan but none since period.   Denies abnormal vaginal bleeding, discharge, pelvic pain, problems with intercourse or other gynecologic concerns.    Gynecologic History Patient's last menstrual period was 07/20/2023. Contraception: none Last Pap: going to collect today. Results were: normal in 2021 Last mammogram:  NA, at 41  Health Maintenance Due  Topic Date Due   INFLUENZA VACCINE  01/11/2023   COVID-19 Vaccine (1 - 2024-25 season) Never done    The following portions of the patient's history were reviewed and updated as appropriate: allergies, current medications, past family history, past medical history, past social history, past surgical history and problem list.  Review of Systems Pertinent items are noted in HPI.   Objective:  BP 118/80   Pulse (!) 102   Wt 130 lb (59 kg)   LMP 07/20/2023   BMI 23.40 kg/m  CONSTITUTIONAL: Well-developed, well-nourished female in no acute distress.  HENT:  Normocephalic, atraumatic, External right and left ear normal. Oropharynx is clear and moist EYES:  No scleral icterus.  NECK: Normal range of motion, supple, no masses.  Normal thyroid.  SKIN: Skin is warm and dry. No rash noted. Not diaphoretic. No erythema. No pallor. NEUROLOGIC: Alert and oriented to person, place, and time. Normal reflexes, muscle tone coordination. No cranial nerve deficit noted. PSYCHIATRIC: Normal mood and affect. Normal behavior. Normal judgment and thought content. CARDIOVASCULAR: Normal heart rate noted,  2+ distal pulses. RESPIRATORY: Effort WNL, no  problems with respiration noted. BREASTS: Symmetric in size. No masses, skin changes, nipple drainage, or lymphadenopathy. ABDOMEN: Soft,  no distention noted.  No tenderness, rebound or guarding.  PELVIC: Normal appearing external genitalia; normal appearing vaginal mucosa and cervix.  No abnormal discharge noted.  Pap smear obtained.  Normal uterine size, no other palpable masses, no uterine or adnexal tenderness. Chaperone present for exam MUSCULOSKELETAL: Normal range of motion.     Assessment and Plan:  1) Annual gynecologic examination with pap smear:  Will follow up results of pap smear and manage accordingly. Routine preventative health maintenance measures emphasized.  2) Contraception counseling: desires pregnancy in 1 year and wants to start ocp. Counseled on taking daily and back up for 2 weeks. UPT here today was negative Results for orders placed or performed in visit on 07/25/23 (from the past 24 hours)  Pregnancy, urine POC     Status: None   Collection Time: 07/25/23 11:13 AM  Result Value Ref Range   Preg Test, Ur NEGATIVE NEGATIVE     1. Well woman exam with routine gynecological exam (Primary) - Cytology - PAP - CBC - Comprehensive metabolic panel - TSH + free T4 - VITAMIN D 25 Hydroxy (Vit-D Deficiency, Fractures) - LO LOESTRIN FE 1 MG-10 MCG / 10 MCG tablet; Take 1 tablet by mouth daily.  Dispense: 84 tablet; Refill: 4  2. Encounter for initial prescription of contraceptive pills - LO LOESTRIN FE 1 MG-10 MCG / 10 MCG tablet; Take 1 tablet by mouth daily.  Dispense: 84 tablet; Refill: 4   Please refer to After Visit Summary for other counseling recommendations.  No follow-ups on file.  Federico Flake, MD, MPH, ABFM Attending Physician Center for [facility]

## 2023-07-26 LAB — COMPREHENSIVE METABOLIC PANEL
ALT: 16 [IU]/L (ref 0–32)
AST: 24 [IU]/L (ref 0–40)
Albumin: 4.2 g/dL (ref 3.9–4.9)
Alkaline Phosphatase: 105 [IU]/L (ref 44–121)
BUN/Creatinine Ratio: 10 (ref 9–23)
BUN: 5 mg/dL — ABNORMAL LOW (ref 6–20)
Bilirubin Total: <0.2 mg/dL (ref 0.0–1.2)
CO2: 22 mmol/L (ref 20–29)
Calcium: 9.7 mg/dL (ref 8.7–10.2)
Chloride: 102 mmol/L (ref 96–106)
Creatinine, Ser: 0.52 mg/dL — ABNORMAL LOW (ref 0.57–1.00)
Globulin, Total: 3.7 g/dL (ref 1.5–4.5)
Glucose: 82 mg/dL (ref 70–99)
Potassium: 4.1 mmol/L (ref 3.5–5.2)
Sodium: 139 mmol/L (ref 134–144)
Total Protein: 7.9 g/dL (ref 6.0–8.5)
eGFR: 123 mL/min/{1.73_m2} (ref >59)

## 2023-07-26 LAB — CBC
Hematocrit: 35.5 % (ref 34.0–46.6)
Hemoglobin: 11.1 g/dL (ref 11.1–15.9)
MCH: 22.9 pg — ABNORMAL LOW (ref 26.6–33.0)
MCHC: 31.3 g/dL — ABNORMAL LOW (ref 31.5–35.7)
MCV: 73 fL — ABNORMAL LOW (ref 79–97)
Platelets: 302 10*3/uL (ref 150–450)
RBC: 4.85 x10E6/uL (ref 3.77–5.28)
RDW: 15.1 % (ref 11.7–15.4)
WBC: 6.4 10*3/uL (ref 3.4–10.8)

## 2023-07-26 LAB — TSH+FREE T4
Free T4: 1.2 ng/dL (ref 0.82–1.77)
TSH: 1.08 u[IU]/mL (ref 0.450–4.500)

## 2023-07-26 LAB — VITAMIN D 25 HYDROXY (VIT D DEFICIENCY, FRACTURES): Vit D, 25-Hydroxy: 60.2 ng/mL (ref 30.0–100.0)

## 2023-07-27 LAB — CYTOLOGY - PAP
Comment: NEGATIVE
Diagnosis: NEGATIVE
High risk HPV: NEGATIVE

## 2023-07-28 ENCOUNTER — Encounter: Payer: Self-pay | Admitting: Family Medicine

## 2024-03-27 ENCOUNTER — Ambulatory Visit (INDEPENDENT_AMBULATORY_CARE_PROVIDER_SITE_OTHER): Admitting: *Deleted

## 2024-03-27 DIAGNOSIS — O219 Vomiting of pregnancy, unspecified: Secondary | ICD-10-CM

## 2024-03-27 DIAGNOSIS — Z3201 Encounter for pregnancy test, result positive: Secondary | ICD-10-CM

## 2024-03-27 DIAGNOSIS — Z3200 Encounter for pregnancy test, result unknown: Secondary | ICD-10-CM

## 2024-03-27 DIAGNOSIS — Z3A01 Less than 8 weeks gestation of pregnancy: Secondary | ICD-10-CM

## 2024-03-27 LAB — POCT PREGNANCY, URINE: Preg Test, Ur: POSITIVE — AB

## 2024-03-27 MED ORDER — PROMETHAZINE HCL 25 MG PO TABS: 25.0000 mg | ORAL_TABLET | Freq: Four times a day (QID) | ORAL | 1 refills | Status: AC | PRN

## 2024-03-27 NOTE — Progress Notes (Signed)
 Patient dropped off a urine for a pregnancy test which was positive. I called her with [facility] Interpreter  [phone] and notified her of result. She reports  sure, normal LMP 02/12/24 which makes her [redacted]w[redacted]d with EDD of 11/18/24.  She has had 3 normal pregnancies. I recommended she start prenatal care with office of her choice. She plans to go here again. I advised her to call front office to schedule new ob visit. . I also reviewed her meds. She request PNV RX and medication for nausea. RX for PNV and Phenergan sent to pharmacy.  Rock [place] PEAK

## 2024-03-27 NOTE — Patient Instructions (Signed)

## 2024-04-29 ENCOUNTER — Other Ambulatory Visit (HOSPITAL_COMMUNITY)
Admission: RE | Admit: 2024-04-29 | Discharge: 2024-04-29 | Disposition: A | Discharge status: Home / Self Care | Source: Ambulatory Visit | Attending: Family Medicine | Admitting: Family Medicine

## 2024-04-29 ENCOUNTER — Ambulatory Visit (INDEPENDENT_AMBULATORY_CARE_PROVIDER_SITE_OTHER)

## 2024-04-29 ENCOUNTER — Other Ambulatory Visit: Payer: Self-pay

## 2024-04-29 VITALS — BP 109/71 | HR 96 | Ht 62.5 in | Wt 141.3 lb

## 2024-04-29 DIAGNOSIS — O0991 Supervision of high risk pregnancy, unspecified, first trimester: Secondary | ICD-10-CM | POA: Diagnosis not present

## 2024-04-29 DIAGNOSIS — O0990 Supervision of high risk pregnancy, unspecified, unspecified trimester: Secondary | ICD-10-CM | POA: Insufficient documentation

## 2024-04-29 DIAGNOSIS — O09521 Supervision of elderly multigravida, first trimester: Secondary | ICD-10-CM

## 2024-04-29 DIAGNOSIS — Z3A11 11 weeks gestation of pregnancy: Secondary | ICD-10-CM

## 2024-04-29 DIAGNOSIS — O09529 Supervision of elderly multigravida, unspecified trimester: Secondary | ICD-10-CM | POA: Insufficient documentation

## 2024-04-29 MED ORDER — BLOOD PRESSURE KIT DEVI: 1.0000 | 0 refills | Status: AC

## 2024-04-29 MED ORDER — PRENATAL PLUS VITAMIN/MINERAL 27-1 MG PO TABS: 1.0000 | ORAL_TABLET | Freq: Every day | ORAL | 10 refills | Status: AC

## 2024-04-29 NOTE — Patient Instructions (Signed)
Safe Medications in Pregnancy   Acne:  Benzoyl Peroxide  Salicylic Acid   Backache/Headache:  Tylenol: 2 regular strength every 4 hours OR               2 Extra strength every 6 hours   Colds/Coughs/Allergies:  Benadryl (alcohol free) 25 mg every 6 hours as needed  Breath right strips  Claritin  Cepacol throat lozenges  Chloraseptic throat spray  Cold-Eeze- up to three times per day  Cough drops, alcohol free  Flonase (by prescription only)  Guaifenesin  Mucinex  Robitussin DM (plain only, alcohol free)  Saline nasal spray/drops  Sudafed (pseudoephedrine) & Actifed * use only after [redacted] weeks gestation and if you do not have high blood pressure  Tylenol  Vicks Vaporub  Zinc lozenges  Zyrtec   Constipation:  Colace  Ducolax suppositories  Fleet enema  Glycerin suppositories  Metamucil  Milk of magnesia  Miralax  Senokot  Smooth move tea   Diarrhea:  Kaopectate  Imodium A-D   *NO pepto Bismol   Hemorrhoids:  Anusol  Anusol HC  Preparation H  Tucks   Indigestion:  Tums  Maalox  Mylanta  Zantac  Pepcid   Insomnia:  Benadryl (alcohol free) 25mg  every 6 hours as needed  Tylenol PM  Unisom, no Gelcaps   Leg Cramps:  Tums  MagGel   Nausea/Vomiting:  Bonine  Dramamine  Emetrol  Ginger extract  Sea bands  Meclizine  Nausea medication to take during pregnancy:  Unisom (doxylamine succinate 25 mg tablets) Take one tablet daily at bedtime. If symptoms are not adequately controlled, the dose can be increased to a maximum recommended dose of two tablets daily (1/2 tablet in the morning, 1/2 tablet mid-afternoon and one at bedtime).  Vitamin B6 100mg  tablets. Take one tablet twice a day (up to 200 mg per day).   Skin Rashes:  Aveeno products  Benadryl cream or 25mg  every 6 hours as needed  Calamine Lotion  1% cortisone cream   Yeast infection:  Gyne-lotrimin 7  Monistat 7    **If taking multiple medications, please check labels to avoid  duplicating the same active ingredients  **take medication as directed on the label  ** Do not exceed 4000 mg of tylenol in 24 hours  **Do not take medications that contain aspirin or ibuprofen            .cwh

## 2024-04-29 NOTE — Progress Notes (Signed)
 New OB Intake  I explained I am completing New OB Intake today. We discussed EDD of 11/18/24 based on LMP of 02/12/24. Pt is G4P3003. I reviewed her allergies, medications and Medical/Surgical/OB history.    Patient Active Problem List   Diagnosis Date Noted   Supervision of high risk pregnancy, antepartum 04/29/2024   Advanced maternal age in multigravida 04/29/2024   Language barrier 08/22/2017     Concerns addressed today  Delivery Plans Plans to deliver at [facility]. Discussed the nature of our practice with multiple providers including residents and students as well as female and female providers. Due to the size of the practice, the delivering provider may not be the same as those providing prenatal care.   Patient is not interested in water birth.  MyChart/Babyscripts MyChart access verified. I explained pt will have some visits in office and some virtually. Babyscripts instructions given and order placed. Patient verifies receipt of registration text/e-mail.   Blood Pressure Cuff/Weight Scale Blood pressure cuff ordered for patient to pick-up from [employer]. Explained after first prenatal appt pt will check weekly and document in Babyscripts. Patient does have weight scale.  Anatomy US  Explained first scheduled US  will be around 19 weeks. Anatomy US  will be scheduled; patient will be notified.  Is patient a CenteringPregnancy candidate?  Not a Candidate   Is patient a Mom+Baby Combined Care candidate?  Not a candidate    Is patient a candidate for Babyscripts Optimization? Yes, patient accepted    First visit review I reviewed new OB appt with patient. Explained pt will be seen by Burnard Moats, MD at first visit 05/05/24. Discussed [place] genetic screening with patient. Patient wants Panorama. Routine prenatal labs collected during New OB Intake. Last Pap Diagnosis  Date Value Ref Range Status  07/25/2023   Final   - Negative for intraepithelial lesion or  malignancy (NILM)    Devon, RN 04/29/2024

## 2024-04-30 ENCOUNTER — Ambulatory Visit: Payer: Self-pay | Admitting: Obstetrics and Gynecology

## 2024-04-30 LAB — CBC/D/PLT+RPR+RH+ABO+RUBIGG...
Antibody Screen: NEGATIVE
Basophils Absolute: 0 x10E3/uL (ref 0.0–0.2)
Basos: 0 %
EOS (ABSOLUTE): 0.1 x10E3/uL (ref 0.0–0.4)
Eos: 1 %
HCV Ab: NONREACTIVE
HIV Screen 4th Generation wRfx: NONREACTIVE
Hematocrit: 33.2 % — ABNORMAL LOW (ref 34.0–46.6)
Hemoglobin: 9.8 g/dL — ABNORMAL LOW (ref 11.1–15.9)
Hepatitis B Surface Ag: NEGATIVE
Immature Grans (Abs): 0 x10E3/uL (ref 0.0–0.1)
Immature Granulocytes: 0 %
Lymphocytes Absolute: 2.2 x10E3/uL (ref 0.7–3.1)
Lymphs: 34 %
MCH: 20.7 pg — ABNORMAL LOW (ref 26.6–33.0)
MCHC: 29.5 g/dL — ABNORMAL LOW (ref 31.5–35.7)
MCV: 70 fL — ABNORMAL LOW (ref 79–97)
Monocytes Absolute: 0.4 x10E3/uL (ref 0.1–0.9)
Monocytes: 5 %
Neutrophils Absolute: 3.8 x10E3/uL (ref 1.4–7.0)
Neutrophils: 60 %
Platelets: 290 x10E3/uL (ref 150–450)
RBC: 4.74 x10E6/uL (ref 3.77–5.28)
RDW: 22.6 % — ABNORMAL HIGH (ref 11.7–15.4)
RPR Ser Ql: NONREACTIVE
Rh Factor: POSITIVE
Rubella Antibodies, IGG: 6.25 {index} (ref >0.99)
WBC: 6.5 x10E3/uL (ref 3.4–10.8)

## 2024-04-30 LAB — GC/CHLAMYDIA PROBE AMP (~~LOC~~) NOT AT ARMC
Chlamydia: NEGATIVE
Comment: NEGATIVE
Comment: NORMAL
Neisseria Gonorrhea: NEGATIVE

## 2024-04-30 LAB — HCV INTERPRETATION

## 2024-05-01 ENCOUNTER — Encounter: Payer: Self-pay | Admitting: *Deleted

## 2024-05-01 DIAGNOSIS — O99019 Anemia complicating pregnancy, unspecified trimester: Secondary | ICD-10-CM | POA: Insufficient documentation

## 2024-05-01 LAB — CULTURE, OB URINE

## 2024-05-01 LAB — URINE CULTURE, OB REFLEX: Organism ID, Bacteria: NO GROWTH

## 2024-05-05 ENCOUNTER — Ambulatory Visit: Payer: Self-pay | Admitting: Obstetrics and Gynecology

## 2024-05-05 ENCOUNTER — Encounter: Payer: Self-pay | Admitting: Obstetrics and Gynecology

## 2024-05-05 VITALS — Wt 145.0 lb

## 2024-05-05 DIAGNOSIS — Z603 Acculturation difficulty: Secondary | ICD-10-CM | POA: Diagnosis not present

## 2024-05-05 DIAGNOSIS — O0991 Supervision of high risk pregnancy, unspecified, first trimester: Secondary | ICD-10-CM | POA: Diagnosis not present

## 2024-05-05 DIAGNOSIS — O99011 Anemia complicating pregnancy, first trimester: Secondary | ICD-10-CM

## 2024-05-05 DIAGNOSIS — O09521 Supervision of elderly multigravida, first trimester: Secondary | ICD-10-CM

## 2024-05-05 DIAGNOSIS — O0990 Supervision of high risk pregnancy, unspecified, unspecified trimester: Secondary | ICD-10-CM

## 2024-05-05 DIAGNOSIS — Z758 Other problems related to medical facilities and other health care: Secondary | ICD-10-CM

## 2024-05-05 DIAGNOSIS — Z3A11 11 weeks gestation of pregnancy: Secondary | ICD-10-CM

## 2024-05-05 DIAGNOSIS — Z31430 Encounter of female for testing for genetic disease carrier status for procreative management: Secondary | ICD-10-CM | POA: Diagnosis not present

## 2024-05-05 LAB — ANEMIA PROFILE B
Ferritin: 10 ng/mL — ABNORMAL LOW (ref 15–150)
Folate: >20 ng/mL (ref >3.0)
Iron Saturation: 28 % (ref 15–55)
Iron: 142 ug/dL (ref 27–159)
Total Iron Binding Capacity: 499 ug/dL — ABNORMAL HIGH (ref 250–450)
UIBC: 357 ug/dL (ref 131–425)
Vitamin B-12: 875 pg/mL (ref 232–1245)

## 2024-05-05 LAB — SPECIMEN STATUS REPORT

## 2024-05-05 LAB — HEMOGLOBIN A1C
Est. average glucose Bld gHb Est-mCnc: 100 mg/dL
Hgb A1c MFr Bld: 5.1 % (ref 4.8–5.6)

## 2024-05-05 MED ORDER — FERROUS SULFATE 325 (65 FE) MG PO TABS: 325.0000 mg | ORAL_TABLET | ORAL | 3 refills | Status: AC

## 2024-05-05 NOTE — Progress Notes (Signed)
 INITIAL PRENATAL VISIT NOTE  Subjective:  Elizabeth Odonnell is a 38 y.o. [phone] at [redacted]w[redacted]d by LMP being seen today for her initial prenatal visit. She has an obstetric history significant for 3 x term SVD. She has a medical history significant for n/a.  Patient reports no complaints.  Contractions: Not present. Vag. Bleeding: None.   . Denies leaking of fluid.    Past Medical History:  Diagnosis Date   Medical history non-contributory    Pelvic pain     Past Surgical History:  Procedure Laterality Date   NO PAST SURGERIES      OB History  Gravida Para Term Preterm AB Living  4 3 3  0 0 3  SAB IAB Ectopic Multiple Live Births  0 0 0 0 3    # Outcome Date GA Lbr Len/2nd Weight Sex Type Anes PTL Lv  4 Current           3 Term 12/21/20 [redacted]w[redacted]d 02:58 / 00:07 8 lb 0.4 oz (3.64 kg) F Vag-Spont EPI  LIV  2 Term 03/12/18 [redacted]w[redacted]d 03:42 / 00:27 8 lb 6.9 oz (3.824 kg) M Vag-Spont EPI  LIV     Birth Comments: IOL Postdates  1 Term 02/23/15 [redacted]w[redacted]d  6 lb 9.8 oz (3 kg) M Vag-Spont Local N LIV     Birth Comments: postdates    Social History   Socioeconomic History   Marital status: Married    Spouse name: Not on file   Number of children: Not on file   Years of education: Not on file   Highest education level: Not on file  Occupational History   Not on file  Tobacco Use   Smoking status: Never   Smokeless tobacco: Never  Vaping Use   Vaping status: Never Used  Substance and Sexual Activity   Alcohol use: No   Drug use: No   Sexual activity: Yes    Birth control/protection: None  Other Topics Concern   Not on file  Social History Narrative   ** Merged History Encounter **       Social Drivers of Health   Financial Resource Strain: Not on file  Food Insecurity: No Food Insecurity (04/29/2024)   Hunger Vital Sign    Worried About Running Out of Food in the Last Year: Never true    Ran Out of Food in the Last Year: Never true  Transportation Needs: No  Transportation Needs (04/29/2024)   PRAPARE - Administrator, Civil Service (Medical): No    Lack of Transportation (Non-Medical): No  Physical Activity: Not on file  Stress: Not on file  Social Connections: Not on file    Family History  Problem Relation Age of Onset   Diabetes Mother      Current Outpatient Medications:    Blood Pressure Monitoring (BLOOD PRESSURE KIT) DEVI, 1 Device by Does not apply route once a week., Disp: 1 each, Rfl: 0   ferrous sulfate  325 (65 FE) MG tablet, Take 1 tablet (325 mg total) by mouth every other day., Disp: 30 tablet, Rfl: 3   Prenatal Vit-Fe Fumarate-FA (PRENATAL PLUS VITAMIN/MINERAL) 27-1 MG TABS, Take 1 tablet by mouth daily., Disp: 30 tablet, Rfl: 10   promethazine  (PHENERGAN ) 25 MG tablet, Take 1 tablet (25 mg total) by mouth every 6 (six) hours as needed for nausea or vomiting., Disp: 30 tablet, Rfl: 1   acetaminophen  (TYLENOL ) 325 MG tablet, Take 2 tablets (650 mg total) by mouth every  6 (six) hours as needed for mild pain, moderate pain, fever or headache (for pain scale < 4). (Patient not taking: Reported on 04/29/2024), Disp: , Rfl:    benzonatate  (TESSALON ) 100 MG capsule, Take 1 capsule (100 mg total) by mouth every 8 (eight) hours. (Patient not taking: Reported on 02/28/2023), Disp: 21 capsule, Rfl: 0   coconut oil OIL, Apply 1 application topically as needed (nipple pain). (Patient not taking: Reported on 02/28/2023), Disp: , Rfl: 0   ibuprofen  (ADVIL ) 600 MG tablet, Take 1 tablet (600 mg total) by mouth every 6 (six) hours. (Patient not taking: Reported on 02/28/2023), Disp: 30 tablet, Rfl: 0   methylPREDNISolone  (MEDROL  DOSEPAK) 4 MG TBPK tablet, Take as directed on package (Patient not taking: Reported on 02/28/2023), Disp: 1 each, Rfl: 0  Allergies  Allergen Reactions   Other Itching    Eggplant--itching in and around mouth    Review of Systems: Negative except for what is mentioned in HPI.  Objective:   Vitals:    05/05/24 1422  Weight: 145 lb (65.8 kg)    Fetal Status:           Physical Exam: Wt 145 lb (65.8 kg)   LMP 02/12/2024 (Exact Date)   BMI 26.10 kg/m  CONSTITUTIONAL: Well-developed, well-nourished female in no acute distress.  NEUROLOGIC: Alert and oriented to person, place, and time. Normal reflexes, muscle tone coordination. No cranial nerve deficit noted. PSYCHIATRIC: Normal mood and affect. Normal behavior. Normal judgment and thought content. SKIN: Skin is warm and dry. No rash noted. Not diaphoretic. No erythema. No pallor. HENT:  Normocephalic, atraumatic, Oropharynx is clear and moist EYES: Conjunctivae and EOM are normal. Pupils are equal, round, and reactive to light. No scleral icterus.  NECK: Normal range of motion, supple, no masses CARDIOVASCULAR: Normal heart rate noted, regular rhythm RESPIRATORY: Effort normal, no problems with respiration noted BREASTS: deferred ABDOMEN: Soft, nontender, nondistended, gravid. GU: deferred MUSCULOSKELETAL: Normal range of motion. EXT:  No edema and no tenderness.    Assessment and Plan:  Pregnancy: G4P3003 at [redacted]w[redacted]d by LMP  1. Supervision of high risk pregnancy, antepartum (Primary) Reviewed [facility] [employer] structure, multiple providers, fellows, medical students, virtual visits, MyChart.  - Hemoglobin A1c  2. [redacted] weeks gestation of pregnancy - Hemoglobin A1c  3. High-risk pregnancy - PANORAMA PRENATAL TEST  4. Encounter of female for testing for genetic disease carrier status for procreative management - [facility] Basic Panel  5. Language barrier Arabic interpretor used  6. Multigravida of advanced maternal age in first trimester  7. Anemia during pregnancy in first trimester Low ferritin, inc TIBC Reviewed findings, may need IV iron at some point, she would prefer to start with PO supplementation   Preterm labor symptoms and general obstetric precautions including but not limited to vaginal  bleeding, contractions, leaking of fluid and fetal movement were reviewed in detail with the patient.  Please refer to After Visit Summary for other counseling recommendations.   Return in about 1 month (around 06/04/2024) for low OB.  Burnard CHRISTELLA Moats 05/05/2024 5:07 PM

## 2024-05-06 ENCOUNTER — Ambulatory Visit: Payer: Self-pay | Admitting: Obstetrics and Gynecology

## 2024-05-12 LAB — PANORAMA PRENATAL TEST FULL PANEL:PANORAMA TEST PLUS 5 ADDITIONAL MICRODELETIONS: FETAL FRACTION: 6.8

## 2024-05-15 LAB — HORIZON CUSTOM: REPORT SUMMARY: NEGATIVE

## 2024-06-03 ENCOUNTER — Other Ambulatory Visit: Payer: Self-pay

## 2024-06-03 ENCOUNTER — Ambulatory Visit (INDEPENDENT_AMBULATORY_CARE_PROVIDER_SITE_OTHER): Admitting: Family Medicine

## 2024-06-03 VITALS — BP 117/71 | HR 98 | Wt 141.0 lb

## 2024-06-03 DIAGNOSIS — O99012 Anemia complicating pregnancy, second trimester: Secondary | ICD-10-CM

## 2024-06-03 DIAGNOSIS — O09522 Supervision of elderly multigravida, second trimester: Secondary | ICD-10-CM | POA: Diagnosis not present

## 2024-06-03 DIAGNOSIS — Z758 Other problems related to medical facilities and other health care: Secondary | ICD-10-CM | POA: Diagnosis not present

## 2024-06-03 DIAGNOSIS — Z3A16 16 weeks gestation of pregnancy: Secondary | ICD-10-CM

## 2024-06-03 DIAGNOSIS — O0992 Supervision of high risk pregnancy, unspecified, second trimester: Secondary | ICD-10-CM

## 2024-06-03 DIAGNOSIS — O99612 Diseases of the digestive system complicating pregnancy, second trimester: Secondary | ICD-10-CM

## 2024-06-03 DIAGNOSIS — Z603 Acculturation difficulty: Secondary | ICD-10-CM

## 2024-06-03 DIAGNOSIS — K5900 Constipation, unspecified: Secondary | ICD-10-CM

## 2024-06-03 DIAGNOSIS — O0990 Supervision of high risk pregnancy, unspecified, unspecified trimester: Secondary | ICD-10-CM

## 2024-06-03 DIAGNOSIS — K219 Gastro-esophageal reflux disease without esophagitis: Secondary | ICD-10-CM

## 2024-06-03 MED ORDER — POLYETHYLENE GLYCOL 3350 17 GM/SCOOP PO POWD: 17.0000 g | Freq: Every day | ORAL | 2 refills | Status: AC | PRN

## 2024-06-03 MED ORDER — FAMOTIDINE 20 MG PO TABS: 20.0000 mg | ORAL_TABLET | Freq: Every day | ORAL | 3 refills | Status: AC

## 2024-06-03 NOTE — Patient Instructions (Signed)
 Start famotidine  once daily. Start MiraLax  once daily.

## 2024-06-03 NOTE — Progress Notes (Signed)
 "  PRENATAL VISIT NOTE  Subjective:  Elizabeth Odonnell is a 38 y.o. [phone] at [redacted]w[redacted]d being seen today for ongoing prenatal care.  She is currently monitored for the following issues for this low-risk pregnancy and has Language barrier; Supervision of high risk pregnancy, antepartum; Advanced maternal age in multigravida; and Anemia in pregnancy on their problem list.  Patient reports postprandial sharp pain on her left side.  The pain starts 30-60 minutes after eating and is resolved after a bowel movement. She report she has had this type of pain even outside of pregnancy. She also endorses acid reflux. Contractions: Not present. Vag. Bleeding: None.  Movement: Present. Denies leaking of fluid.   The following portions of the patient's history were reviewed and updated as appropriate: allergies, current medications, past family history, past medical history, past social history, past surgical history and problem list.   Objective:   Vitals:   06/03/24 1006  BP: 117/71  Pulse: 98  Weight: 141 lb (64 kg)    Fetal Status:  Fetal Heart Rate (bpm): 140   Movement: Present    General: Alert, oriented and cooperative. Patient is in no acute distress.  Skin: Skin is warm and dry. No rash noted.   Cardiovascular: Normal heart rate noted  Respiratory: Normal respiratory effort, no problems with respiration noted  Abdomen: Soft, gravid, appropriate for gestational age.  Pain/Pressure: Absent     Pelvic: Cervical exam deferred        Extremities: Normal range of motion.  Edema: None  Mental Status: Normal mood and affect. Normal behavior. Normal judgment and thought content.      05/05/2024    2:25 PM 04/29/2024    4:59 PM 06/07/2023    2:56 PM  Depression screen PHQ 2/9  Decreased Interest 0 0 0  Down, Depressed, Hopeless 0 0 0  PHQ - 2 Score 0 0 0  Altered sleeping 1 0 0  Tired, decreased energy 0 0 1  Change in appetite 0 0 0  Feeling bad or failure about yourself  0 0 0   Trouble concentrating 0 0 0  Moving slowly or fidgety/restless 0 0 0  Suicidal thoughts 0 0 0  PHQ-9 Score 1 0 1      Data saved with a previous flowsheet row definition        05/05/2024    2:25 PM 04/29/2024    5:00 PM 06/07/2023    2:56 PM 12/15/2020    1:27 PM  GAD 7 : Generalized Anxiety Score  Nervous, Anxious, on Edge 0 0 0 0  Control/stop worrying 0 0 0 0  Worry too much - different things 0 0 0 0  Trouble relaxing 0 0 0 0  Restless 0 0 0 0  Easily annoyed or irritable 0 0 0 0  Afraid - awful might happen 0 0 0 0  Total GAD 7 Score 0 0 0 0    Assessment and Plan:  Pregnancy: G4P3003 at [redacted]w[redacted]d 1. Supervision of high risk pregnancy, antepartum (Primary) Prenatal course reviewed BP, HR, FHR within normal limits  2. Multigravida of advanced maternal age in second trimester  3. Anemia during pregnancy in second trimester Continue PO iron supplement  4. Language barrier Due to language barrier, an in-person interpreter was present during the history-taking, physical exam, and subsequent discussion with this patient.    5. [redacted] weeks gestation of pregnancy AFP today Follow up in 4 weeks  6. Gastroesophageal reflux in pregnancy -  famotidine  (PEPCID ) 20 MG tablet; Take 1 tablet (20 mg total) by mouth daily.  Dispense: 60 tablet; Refill: 3  7. Constipation, unspecified constipation type MiraLax  daily.   Preterm labor symptoms and general obstetric precautions including but not limited to vaginal bleeding, contractions, leaking of fluid and fetal movement were reviewed in detail with the patient. Please refer to After Visit Summary for other counseling recommendations.   Return in about 4 weeks (around 07/01/2024) for LOB.  Future Appointments  Date Time Provider Department Center  08/20/2024  8:00 AM [facility] PROVIDER 1 [facility]  08/20/2024  8:30 AM [facility] US1 [facility]    Joesph DELENA Sear, PA "

## 2024-06-03 NOTE — Addendum Note (Signed)
 Addended byBETHA BARTHOLOMEW BARMAN on: 06/03/2024 10:35 AM   Modules accepted: Orders

## 2024-06-07 LAB — AFP, SERUM, OPEN SPINA BIFIDA
AFP MoM: 0.57
AFP Value: 19.3 ng/mL
Gest. Age on Collection Date: 16 wk
Maternal Age At EDD: 37.7 a
OSBR Risk 1 IN: 10000
Test Results:: NEGATIVE
Weight: 141 [lb_av]

## 2024-06-08 ENCOUNTER — Ambulatory Visit: Payer: Self-pay | Admitting: Family Medicine

## 2024-07-03 ENCOUNTER — Other Ambulatory Visit: Payer: Self-pay

## 2024-07-03 ENCOUNTER — Ambulatory Visit: Payer: Self-pay | Admitting: Student

## 2024-07-03 VITALS — BP 124/64 | HR 99 | Wt 144.5 lb

## 2024-07-03 DIAGNOSIS — O99891 Other specified diseases and conditions complicating pregnancy: Secondary | ICD-10-CM

## 2024-07-03 DIAGNOSIS — Z603 Acculturation difficulty: Secondary | ICD-10-CM

## 2024-07-03 DIAGNOSIS — O99012 Anemia complicating pregnancy, second trimester: Secondary | ICD-10-CM

## 2024-07-03 DIAGNOSIS — Z23 Encounter for immunization: Secondary | ICD-10-CM | POA: Diagnosis not present

## 2024-07-03 DIAGNOSIS — Z758 Other problems related to medical facilities and other health care: Secondary | ICD-10-CM | POA: Diagnosis not present

## 2024-07-03 DIAGNOSIS — Z3A20 20 weeks gestation of pregnancy: Secondary | ICD-10-CM | POA: Diagnosis not present

## 2024-07-03 DIAGNOSIS — M549 Dorsalgia, unspecified: Secondary | ICD-10-CM | POA: Diagnosis not present

## 2024-07-03 DIAGNOSIS — O0992 Supervision of high risk pregnancy, unspecified, second trimester: Secondary | ICD-10-CM | POA: Diagnosis not present

## 2024-07-03 DIAGNOSIS — O0990 Supervision of high risk pregnancy, unspecified, unspecified trimester: Secondary | ICD-10-CM

## 2024-07-03 LAB — CBC
Hematocrit: 36.4 % (ref 34.0–46.6)
Hemoglobin: 11.9 g/dL (ref 11.1–15.9)
MCH: 25.6 pg — ABNORMAL LOW (ref 26.6–33.0)
MCHC: 32.7 g/dL (ref 31.5–35.7)
MCV: 78 fL — ABNORMAL LOW (ref 79–97)
Platelets: 210 x10E3/uL (ref 150–450)
RBC: 4.64 x10E6/uL (ref 3.77–5.28)
RDW: 20.1 % — ABNORMAL HIGH (ref 11.7–15.4)
WBC: 8.4 x10E3/uL (ref 3.4–10.8)

## 2024-07-03 MED ORDER — CYCLOBENZAPRINE HCL 10 MG PO TABS: 10.0000 mg | ORAL_TABLET | Freq: Three times a day (TID) | ORAL | 0 refills | Status: AC | PRN

## 2024-07-03 NOTE — Assessment & Plan Note (Addendum)
-  Denies contractions, LOF, or vaginal bleeding. Reports good fetal movement.  -Flu vaccine given today -Reports low back pain. Worse on right side, especially after sleeping. No urinary complaints & no CVAT. Discussed head & tylenol . Rx flexeril  prn.

## 2024-07-03 NOTE — Assessment & Plan Note (Addendum)
-  Arabic interpreter present for today's encounter

## 2024-07-03 NOTE — Assessment & Plan Note (Addendum)
-  Taking oral iron for over a month now. Reports some dizziness recently. Will recheck CBC today. May need IV iron.

## 2024-07-03 NOTE — Progress Notes (Signed)
 "  PRENATAL VISIT NOTE  Subjective:  Elizabeth Odonnell is a 39 y.o. [phone] at [redacted]w[redacted]d being seen today for ongoing prenatal care.  She is currently monitored for the following issues for this low-risk pregnancy and has Language barrier; Supervision of high risk pregnancy, antepartum; Advanced maternal age in multigravida; and Anemia in pregnancy on their problem list.  Patient reports backache and dizziness.   . Vag. Bleeding: None.   . Denies leaking of fluid.   The following portions of the patient's history were reviewed and updated as appropriate: allergies, current medications, past family history, past medical history, past social history, past surgical history and problem list.   Objective:   Vitals:   07/03/24 1126  BP: 124/64  Pulse: 99  Weight: 144 lb 8 oz (65.5 kg)    Fetal Status:  Fetal Heart Rate (bpm): 149 Fundal Height: 22 cm      General: Alert, oriented and cooperative. Patient is in no acute distress.  Skin: Skin is warm and dry. No rash noted.   Cardiovascular: Normal heart rate noted  Respiratory: Normal respiratory effort, no problems with respiration noted  Abdomen: Soft, gravid, appropriate for gestational age.  Pain/Pressure: Absent     Pelvic: Cervical exam deferred        Extremities: Normal range of motion.     Mental Status: Normal mood and affect. Normal behavior. Normal judgment and thought content.      05/05/2024    2:25 PM 04/29/2024    4:59 PM 06/07/2023    2:56 PM  Depression screen PHQ 2/9  Decreased Interest 0 0 0  Down, Depressed, Hopeless 0 0 0  PHQ - 2 Score 0 0 0  Altered sleeping 1 0 0  Tired, decreased energy 0 0 1  Change in appetite 0 0 0  Feeling bad or failure about yourself  0 0 0  Trouble concentrating 0 0 0  Moving slowly or fidgety/restless 0 0 0  Suicidal thoughts 0 0 0  PHQ-9 Score 1 0 1      Data saved with a previous flowsheet row definition        05/05/2024    2:25 PM 04/29/2024    5:00 PM  06/07/2023    2:56 PM 12/15/2020    1:27 PM  GAD 7 : Generalized Anxiety Score  Nervous, Anxious, on Edge 0  0  0  0   Control/stop worrying 0  0  0  0   Worry too much - different things 0  0  0  0   Trouble relaxing 0  0  0  0   Restless 0  0  0  0   Easily annoyed or irritable 0  0  0  0   Afraid - awful might happen 0  0  0  0   Total GAD 7 Score 0 0 0 0     Data saved with a previous flowsheet row definition    Assessment and Plan:  Pregnancy: [id number] at [redacted]w[redacted]d  Assessment & Plan Supervision of high risk pregnancy, antepartum -Denies contractions, LOF, or vaginal bleeding. Reports good fetal movement.  -Flu vaccine given today -Reports low back pain. Worse on right side, especially after sleeping. No urinary complaints & no CVAT. Discussed head & tylenol . Rx flexeril  prn.  Anemia during pregnancy in second trimester -Taking oral iron for over a month now. Reports some dizziness recently. Will recheck CBC today. May need IV iron.  Back pain  affecting pregnancy  Language barrier -Arabic interpreter present for today's encounter    Preterm labor symptoms and general obstetric precautions including but not limited to vaginal bleeding, contractions, leaking of fluid and fetal movement were reviewed in detail with the patient. Please refer to After Visit Summary for other counseling recommendations.   Return in about 4 weeks (around 07/31/2024) for Routine OB.  Future Appointments  Date Time Provider Department Center  07/29/2024 10:35 AM Regino Camie DELENA EDDY [facility]  08/20/2024  8:00 AM [facility] PROVIDER 1 [facility]  08/20/2024  8:30 AM [facility] US1 [facility]    Rocky Satterfield, NP  "

## 2024-07-29 ENCOUNTER — Encounter: Payer: Self-pay | Admitting: Certified Nurse Midwife

## 2024-08-06 ENCOUNTER — Other Ambulatory Visit

## 2024-08-06 ENCOUNTER — Ambulatory Visit

## 2024-08-20 ENCOUNTER — Other Ambulatory Visit
